# Patient Record
Sex: Male | Born: 1935
Health system: Southern US, Community
[De-identification: ages and names within clinical notes are randomized; demographics above are authoritative.]

## PROBLEM LIST (undated history)

## (undated) DIAGNOSIS — N189 Chronic kidney disease, unspecified: Secondary | ICD-10-CM

## (undated) DIAGNOSIS — Z5189 Encounter for other specified aftercare: Secondary | ICD-10-CM

## (undated) DIAGNOSIS — C801 Malignant (primary) neoplasm, unspecified: Secondary | ICD-10-CM

## (undated) DIAGNOSIS — C61 Malignant neoplasm of prostate: Secondary | ICD-10-CM

## (undated) DIAGNOSIS — H269 Unspecified cataract: Secondary | ICD-10-CM

## (undated) DIAGNOSIS — F419 Anxiety disorder, unspecified: Secondary | ICD-10-CM

## (undated) DIAGNOSIS — R011 Cardiac murmur, unspecified: Secondary | ICD-10-CM

## (undated) DIAGNOSIS — I10 Essential (primary) hypertension: Secondary | ICD-10-CM

## (undated) DIAGNOSIS — G4730 Sleep apnea, unspecified: Secondary | ICD-10-CM

## (undated) DIAGNOSIS — IMO0001 Reserved for inherently not codable concepts without codable children: Secondary | ICD-10-CM

## (undated) DIAGNOSIS — D649 Anemia, unspecified: Secondary | ICD-10-CM

## (undated) DIAGNOSIS — J189 Pneumonia, unspecified organism: Secondary | ICD-10-CM

## (undated) DIAGNOSIS — M1990 Unspecified osteoarthritis, unspecified site: Secondary | ICD-10-CM

## (undated) DIAGNOSIS — F329 Major depressive disorder, single episode, unspecified: Secondary | ICD-10-CM

## (undated) DIAGNOSIS — I00 Rheumatic fever without heart involvement: Secondary | ICD-10-CM

## (undated) DIAGNOSIS — G709 Myoneural disorder, unspecified: Secondary | ICD-10-CM

## (undated) DIAGNOSIS — I209 Angina pectoris, unspecified: Secondary | ICD-10-CM

## (undated) HISTORY — PX: JOINT REPLACEMENT: SHX530

## (undated) HISTORY — PX: CARDIAC CATHETERIZATION: SHX172

## (undated) HISTORY — PX: TONSILLECTOMY: SUR1361

## (undated) HISTORY — PX: PROSTATE CRYOABLATION: SUR358

---

## 1998-10-03 ENCOUNTER — Other Ambulatory Visit: Admission: RE | Admit: 1998-10-03 | Discharge: 1998-10-03 | Payer: Self-pay | Admitting: Urology

## 1998-10-24 ENCOUNTER — Encounter: Admission: RE | Admit: 1998-10-24 | Discharge: 1999-01-22 | Payer: Self-pay | Admitting: Radiation Oncology

## 1999-01-22 ENCOUNTER — Encounter: Admission: RE | Admit: 1999-01-22 | Discharge: 1999-04-22 | Payer: Self-pay | Admitting: Radiation Oncology

## 2000-07-29 ENCOUNTER — Encounter: Payer: Self-pay | Admitting: Family Medicine

## 2000-07-29 ENCOUNTER — Encounter: Admission: RE | Admit: 2000-07-29 | Discharge: 2000-07-29 | Payer: Self-pay | Admitting: Family Medicine

## 2001-10-06 ENCOUNTER — Encounter: Admission: RE | Admit: 2001-10-06 | Discharge: 2001-10-06 | Payer: Self-pay

## 2001-10-06 ENCOUNTER — Encounter: Payer: Self-pay | Admitting: Family Medicine

## 2001-12-29 ENCOUNTER — Ambulatory Visit (HOSPITAL_COMMUNITY): Admission: RE | Admit: 2001-12-29 | Discharge: 2001-12-29 | Payer: Self-pay | Admitting: Gastroenterology

## 2002-03-22 ENCOUNTER — Encounter: Payer: Self-pay | Admitting: Family Medicine

## 2002-03-22 ENCOUNTER — Encounter: Admission: RE | Admit: 2002-03-22 | Discharge: 2002-03-22 | Payer: Self-pay | Admitting: Family Medicine

## 2003-05-03 ENCOUNTER — Encounter: Payer: Self-pay | Admitting: Emergency Medicine

## 2003-05-03 ENCOUNTER — Inpatient Hospital Stay (HOSPITAL_COMMUNITY): Admission: EM | Admit: 2003-05-03 | Discharge: 2003-05-08 | Payer: Self-pay | Admitting: Emergency Medicine

## 2003-05-04 ENCOUNTER — Encounter (INDEPENDENT_AMBULATORY_CARE_PROVIDER_SITE_OTHER): Payer: Self-pay | Admitting: *Deleted

## 2003-11-13 ENCOUNTER — Inpatient Hospital Stay (HOSPITAL_COMMUNITY): Admission: RE | Admit: 2003-11-13 | Discharge: 2003-11-18 | Payer: Self-pay | Admitting: Orthopaedic Surgery

## 2004-09-02 ENCOUNTER — Encounter: Admission: RE | Admit: 2004-09-02 | Discharge: 2004-09-02 | Payer: Self-pay | Admitting: Cardiovascular Disease

## 2004-09-04 ENCOUNTER — Encounter: Admission: RE | Admit: 2004-09-04 | Discharge: 2004-09-04 | Payer: Self-pay | Admitting: Family Medicine

## 2004-09-05 ENCOUNTER — Ambulatory Visit (HOSPITAL_COMMUNITY): Admission: RE | Admit: 2004-09-05 | Discharge: 2004-09-05 | Payer: Self-pay | Admitting: Cardiovascular Disease

## 2006-01-15 ENCOUNTER — Ambulatory Visit (HOSPITAL_COMMUNITY): Admission: RE | Admit: 2006-01-15 | Discharge: 2006-01-15 | Payer: Self-pay | Admitting: Urology

## 2006-05-27 ENCOUNTER — Emergency Department (HOSPITAL_COMMUNITY): Admission: EM | Admit: 2006-05-27 | Discharge: 2006-05-27 | Payer: Self-pay | Admitting: Emergency Medicine

## 2006-06-01 ENCOUNTER — Encounter: Admission: RE | Admit: 2006-06-01 | Discharge: 2006-06-01 | Payer: Self-pay | Admitting: Family Medicine

## 2006-12-01 ENCOUNTER — Ambulatory Visit (HOSPITAL_COMMUNITY): Admission: RE | Admit: 2006-12-01 | Discharge: 2006-12-01 | Payer: Self-pay | Admitting: Nephrology

## 2007-05-13 ENCOUNTER — Ambulatory Visit (HOSPITAL_COMMUNITY): Admission: RE | Admit: 2007-05-13 | Discharge: 2007-05-13 | Payer: Self-pay | Admitting: Gastroenterology

## 2009-11-24 ENCOUNTER — Ambulatory Visit: Payer: Self-pay | Admitting: Cardiology

## 2009-11-24 ENCOUNTER — Observation Stay (HOSPITAL_COMMUNITY): Admission: EM | Admit: 2009-11-24 | Discharge: 2009-11-26 | Payer: Self-pay | Admitting: Emergency Medicine

## 2010-02-20 ENCOUNTER — Ambulatory Visit (HOSPITAL_BASED_OUTPATIENT_CLINIC_OR_DEPARTMENT_OTHER): Admission: RE | Admit: 2010-02-20 | Discharge: 2010-02-20 | Payer: Self-pay | Admitting: Urology

## 2010-03-10 ENCOUNTER — Encounter: Admission: RE | Admit: 2010-03-10 | Discharge: 2010-03-10 | Payer: Self-pay | Admitting: Geriatric Medicine

## 2010-05-23 ENCOUNTER — Encounter: Admission: RE | Admit: 2010-05-23 | Discharge: 2010-05-23 | Payer: Self-pay | Admitting: Internal Medicine

## 2010-09-24 ENCOUNTER — Other Ambulatory Visit: Admission: RE | Admit: 2010-09-24 | Discharge: 2010-09-24 | Payer: Self-pay | Admitting: Interventional Radiology

## 2010-09-24 ENCOUNTER — Encounter: Admission: RE | Admit: 2010-09-24 | Discharge: 2010-09-24 | Payer: Self-pay | Admitting: Geriatric Medicine

## 2011-03-08 LAB — POCT I-STAT 4, (NA,K, GLUC, HGB,HCT)
Glucose, Bld: 111 mg/dL — ABNORMAL HIGH (ref 70–99)
HCT: 38 % — ABNORMAL LOW (ref 39.0–52.0)
Hemoglobin: 12.9 g/dL — ABNORMAL LOW (ref 13.0–17.0)
Potassium: 4 mEq/L (ref 3.5–5.1)
Sodium: 144 mEq/L (ref 135–145)

## 2011-03-08 LAB — GLUCOSE, CAPILLARY: Glucose-Capillary: 99 mg/dL (ref 70–99)

## 2011-03-17 LAB — PROTIME-INR
INR: 1.06 (ref 0.00–1.49)
Prothrombin Time: 13.7 seconds (ref 11.6–15.2)

## 2011-03-17 LAB — CBC
HCT: 35.8 % — ABNORMAL LOW (ref 39.0–52.0)
HCT: 36.5 % — ABNORMAL LOW (ref 39.0–52.0)
HCT: 39.7 % (ref 39.0–52.0)
Hemoglobin: 13.8 g/dL (ref 13.0–17.0)
MCHC: 34.5 g/dL (ref 30.0–36.0)
MCHC: 34.7 g/dL (ref 30.0–36.0)
MCV: 98.1 fL (ref 78.0–100.0)
MCV: 98.7 fL (ref 78.0–100.0)
MCV: 98.9 fL (ref 78.0–100.0)
Platelets: 134 10*3/uL — ABNORMAL LOW (ref 150–400)
Platelets: 150 10*3/uL (ref 150–400)
RBC: 3.62 MIL/uL — ABNORMAL LOW (ref 4.22–5.81)
RBC: 3.7 MIL/uL — ABNORMAL LOW (ref 4.22–5.81)
RBC: 4.05 MIL/uL — ABNORMAL LOW (ref 4.22–5.81)
RDW: 13.8 % (ref 11.5–15.5)
WBC: 6.7 10*3/uL (ref 4.0–10.5)
WBC: 6.7 10*3/uL (ref 4.0–10.5)

## 2011-03-17 LAB — DIFFERENTIAL
Basophils Absolute: 0 10*3/uL (ref 0.0–0.1)
Basophils Relative: 0 % (ref 0–1)
Eosinophils Absolute: 0.1 10*3/uL (ref 0.0–0.7)
Eosinophils Relative: 2 % (ref 0–5)
Lymphocytes Relative: 20 % (ref 12–46)
Lymphs Abs: 1.4 10*3/uL (ref 0.7–4.0)
Monocytes Absolute: 0.7 10*3/uL (ref 0.1–1.0)
Monocytes Relative: 11 % (ref 3–12)
Neutro Abs: 4.4 10*3/uL (ref 1.7–7.7)
Neutrophils Relative %: 67 % (ref 43–77)

## 2011-03-17 LAB — BASIC METABOLIC PANEL
BUN: 30 mg/dL — ABNORMAL HIGH (ref 6–23)
CO2: 29 mEq/L (ref 19–32)
Calcium: 8.9 mg/dL (ref 8.4–10.5)
Chloride: 102 mEq/L (ref 96–112)
Chloride: 105 mEq/L (ref 96–112)
Creatinine, Ser: 1.76 mg/dL — ABNORMAL HIGH (ref 0.4–1.5)
GFR calc Af Amer: 42 mL/min — ABNORMAL LOW (ref >60)
GFR calc Af Amer: 46 mL/min — ABNORMAL LOW (ref >60)
GFR calc non Af Amer: 35 mL/min — ABNORMAL LOW (ref >60)
GFR calc non Af Amer: 38 mL/min — ABNORMAL LOW (ref >60)
Glucose, Bld: 101 mg/dL — ABNORMAL HIGH (ref 70–99)
Potassium: 3.9 mEq/L (ref 3.5–5.1)
Potassium: 3.9 mEq/L (ref 3.5–5.1)
Sodium: 139 mEq/L (ref 135–145)
Sodium: 143 mEq/L (ref 135–145)

## 2011-03-17 LAB — URINALYSIS, ROUTINE W REFLEX MICROSCOPIC
Bilirubin Urine: NEGATIVE
Glucose, UA: NEGATIVE mg/dL
Hgb urine dipstick: NEGATIVE
Ketones, ur: NEGATIVE mg/dL
Nitrite: NEGATIVE
Protein, ur: NEGATIVE mg/dL
Specific Gravity, Urine: 1.011 (ref 1.005–1.030)
Urobilinogen, UA: 0.2 mg/dL (ref 0.0–1.0)
pH: 7 (ref 5.0–8.0)

## 2011-03-17 LAB — CARDIAC PANEL(CRET KIN+CKTOT+MB+TROPI)
CK, MB: 1 ng/mL (ref 0.3–4.0)
Relative Index: INVALID (ref 0.0–2.5)
Relative Index: INVALID (ref 0.0–2.5)
Total CK: 60 U/L (ref 7–232)
Troponin I: 0.01 ng/mL (ref 0.00–0.06)
Troponin I: 0.02 ng/mL (ref 0.00–0.06)

## 2011-03-17 LAB — COMPREHENSIVE METABOLIC PANEL
AST: 20 U/L (ref 0–37)
BUN: 28 mg/dL — ABNORMAL HIGH (ref 6–23)
CO2: 27 mEq/L (ref 19–32)
Calcium: 8.3 mg/dL — ABNORMAL LOW (ref 8.4–10.5)
Creatinine, Ser: 1.74 mg/dL — ABNORMAL HIGH (ref 0.4–1.5)
GFR calc Af Amer: 47 mL/min — ABNORMAL LOW (ref >60)
GFR calc non Af Amer: 38 mL/min — ABNORMAL LOW (ref >60)

## 2011-03-17 LAB — POCT CARDIAC MARKERS
CKMB, poc: 1.1 ng/mL (ref 1.0–8.0)
CKMB, poc: 1.7 ng/mL (ref 1.0–8.0)
Myoglobin, poc: 128 ng/mL (ref 12–200)
Myoglobin, poc: 207 ng/mL (ref 12–200)
Troponin i, poc: <0.05 ng/mL (ref 0.00–0.09)
Troponin i, poc: <0.05 ng/mL (ref 0.00–0.09)

## 2011-03-17 LAB — LIPID PANEL
Cholesterol: 147 mg/dL (ref 0–200)
LDL Cholesterol: 83 mg/dL (ref 0–99)
Total CHOL/HDL Ratio: 5.1 RATIO

## 2011-03-17 LAB — GLUCOSE, CAPILLARY
Glucose-Capillary: 105 mg/dL — ABNORMAL HIGH (ref 70–99)
Glucose-Capillary: 142 mg/dL — ABNORMAL HIGH (ref 70–99)
Glucose-Capillary: 152 mg/dL — ABNORMAL HIGH (ref 70–99)
Glucose-Capillary: 72 mg/dL (ref 70–99)
Glucose-Capillary: 96 mg/dL (ref 70–99)

## 2011-03-17 LAB — HEMOGLOBIN A1C
Hgb A1c MFr Bld: 5.8 % (ref 4.6–6.1)
Mean Plasma Glucose: 120 mg/dL

## 2011-04-28 NOTE — Op Note (Signed)
NAME:  Christopher Hensley, Christopher Hensley              ACCOUNT NO.:  [id number]   MEDICAL RECORD NO.:  [id number]          PATIENT TYPE:  AMB   LOCATION:  ENDO                         FACILITY:  [facility]   PHYSICIAN:  Anselmo Rod, M.D.  DATE OF BIRTH:  Sep 06, 1932   DATE OF PROCEDURE:  DATE OF DISCHARGE:                               OPERATIVE REPORT   PROCEDURE PERFORMED:  Screening colonoscopy.   SURGEON:  Engineer, building services.   INSTRUMENT USED:  Pentax video colonoscope.   INDICATIONS FOR PROCEDURE:  A 77 year old white male with a personal  history of prostate cancer and occasional rectal bleeding undergoing  screening colonoscopy to rule out colonic polyps, masses, etc.   PREPROCEDURE PREPARATION:  Informed consent was procured from the  patient.  The patient fasted for 8 hours prior to the procedure and  prepped with Dulcolax pills and a gallon of NuLYTELY the night prior to  the procedure.  Risks and benefits of the procedure including a 10% miss  rate of cancer and polyp were discussed with the patient as well.   PREPROCEDURE PHYSICAL:  The patient had stable vital signs.  Neck was  supple.  Chest clear to auscultation.  S1 and S2 regular.  Abdomen soft  with normal bowel sounds.   DESCRIPTION OF PROCEDURE:  The patient was placed in the left lateral  decubitus position and sedated with 50 mcg of Fentanyl and 5 mg of  Versed given intravenously in slow incremental doses. Once the patient  was adequately sedated and maintained on low-flow oxygen and continuous  cardiac monitoring the Pentax video colonoscope was advanced from the  rectum to cecum.  The appendiceal orifice and cecal valve were clearly  visualized and photographed. The terminal ileum appeared healthy and  without lesions.  No masses or polyps were seen.  There was scattered  diverticula throughout the colon with more prominent changes in the left  colon.  Small internal hemorrhoids were seen on retroflexion.  The rest  of the  exam was unremarkable.  The patient tolerated the procedure well  without complications.   IMPRESSION:  1. Small internal hemorrhoids seen on retroflexion.  2. Scattered diverticulosis with more prominent changes in the left      colon.  3. Normal terminal ileum.   RECOMMENDATIONS:  1. Continue high fiber diet with liberal fluid intake.  Brochures on      diverticulosis have been given to the patient for his education.  2. Repeat colonoscopy in the next 10 years.  If the patient has any      abnormal symptoms in the interim he should contact the office      immediately for further recommendations.  3. Outpatient follow-up as need arises in the future.      Anselmo Rod, M.D.  Electronically Signed     JNM/MEDQ  D:  05/13/2007  T:  05/13/2007  Job:  [id number]   cc:   Vonzell Schlatter. Patsi Sears, M.D.

## 2011-05-01 NOTE — H&P (Signed)
NAME:  Christopher Hensley, Christopher Hensley                        ACCOUNT NO.:  [id number]   MEDICAL RECORD NO.:  [id number]                   PATIENT TYPE:  INP   LOCATION:  1824                                 FACILITY:  [facility]   PHYSICIAN:  Ollen Gross. Vernell Morgans, M.D.              DATE OF BIRTH:  1932/08/31   DATE OF ADMISSION:  05/03/2003  DATE OF DISCHARGE:                                HISTORY & PHYSICAL   HISTORY OF PRESENT ILLNESS:  Christopher Hensley is a 77 year old white male who  has a chief complaint of abdominal pain.  Christopher Hensley states that this  abdominal pain  has been occurring intermittently for the last three weeks  or so.  The pain seems to come and go, but over the last week or so has been  more severe and more frequent.  He has not had any nausea or vomiting.  His  bowels have been a little bit loose, but they have been that way since  receiving radiation therapy for prostate cancer, but his bowels have not  changed over the recent weeks.  The pain is mostly in the left mid abdomen,  but he complains of some diffuse tenderness.  He has run low-grade fevers at  home around 99 degrees, but no significant fever.   REVIEW OF SYSTEMS:  He denies any nausea, vomiting, fever, chills, chest  pain, shortness of breath, dysuria, or constipation.  The rest of his review  of systems is unremarkable.   PAST MEDICAL HISTORY:  Significant for:  1. Hypertension.  2. Prostate cancer.  3. Gout.   PAST SURGICAL HISTORY:  Significant for tonsillectomy.   MEDICATIONS:  1. Zestoretic 20/25 mg one p.o. daily.  2. Lasix 40 mg p.o. daily.  3. Celebrex 200 mg p.o. daily.   ALLERGIES:  No known drug allergies.   SOCIAL HISTORY:  The patient drinks alcohol.  Denies any tobacco use.   FAMILY HISTORY:  Significant for unknown cancers.   PHYSICAL EXAMINATION:  VITAL SIGNS:  His temperature is 97.3 degrees, blood  pressure 174/78, and pulse 61.  GENERAL APPEARANCE:  He is a well-developed, well-nourished,  white male,  moderately obese and in no acute distress.  SKIN:  Warm and dry with no jaundice.  HEENT:  His extraocular movements are intact.  Pupils equal, round, and  reactive to light.  The sclerae are nonicteric.  NECK:  No bruits.  I cannot palpate any thyroid mass.  His trachea is  midline.  LUNGS:  Clear to auscultation bilaterally with no use of accessory  respiratory muscles.  HEART:  Regular rate and rhythm with an impulse in the left chest.  ABDOMEN:  Obese, but soft with mild tenderness in the left mid abdomen.  No  guarding or peritoneal signs.  I cannot palpate mass or hepatosplenomegaly.  EXTREMITIES:  No cyanosis, clubbing, or edema.  PSYCHOLOGIC:  He is alert and oriented x 3  with evidence of depression or  anxiety.   LABORATORY DATA:  On review of his laboratory work, his UA is normal.  His  liver functions are normal.  His white count is 16,100 with a hemoglobin of  12.8, a hematocrit of 37.4, and a platelet count of 262,000.  He has a  normal amylase and lipase.  On review of his CT scan, he has what appears to  be a small diverticulum off his lower small bowel that fills with contrast  with some inflammatory changes around it.  No evidence of perforation or  abscess.   ASSESSMENT AND PLAN:  This is a 77 year old white male with what appears to  be a possible Meckel's diverticulitis.  We will plan to admit him for pain  control and start him on antibiotics and broad spectrum antibiotic coverage  and acid blocker agents.  We will follow him clinically and see how he does.  If he improves, then we will talk to him and his family about undergoing  elective procedure to resect this diverticulum.  If he worsens, he may  require more a urgent type of surgery.  I have discussed this plan with him  and his family and they are agreeable and understand.  All of their  questions were answered.  We will plan to admit him today and follow him.                                                Ollen Gross. Vernell Morgans, M.D.    PST/MEDQ  D:  05/03/2003  T:  05/03/2003  Job:  [id number]

## 2011-05-01 NOTE — Consult Note (Signed)
NAME:  Christopher Hensley, Christopher Hensley                        ACCOUNT NO.:  [id number]   MEDICAL RECORD NO.:  [id number]                   PATIENT TYPE:  INP   LOCATION:  5037                                 FACILITY:  [facility]   PHYSICIAN:  Norwood Levo, MD               DATE OF BIRTH:  December 26, 1931   DATE OF CONSULTATION:  DATE OF DISCHARGE:                                   CONSULTATION   PRIMARY CARE PHYSICIAN:  Talmadge Coventry, M.D.   CHIEF COMPLAINT:  Bilateral knee pain.   HISTORY OF PRESENT ILLNESS:  A 77 year old white male with bilateral knee  pain, left greater than right. Undergone evaluation by Dr. Cleophas Dunker with  diagnosis of end-stage osteoarthritic left knee status post left total knee  replacement. At present I am called to evaluate persistent fever over the  last 48-72 hours. The patient describes preoperative respiratory infection.  Chest x-ray done preoperatively shows no infiltrates at that time. This was  on the 24th of November and the patient however was given empiric  amoxicillin, which he had taken seven days worth prior to this operative  intervention. The patient describes a vague symptomatology with no SOB, no  fevers, chills, nausea, vomiting, or diarrhea at that time and could not  describe whether he has had any purulent expectorate prior to being  hospitalized for his left knee surgery. At this time he is a poor historian,  vague. Describes only intermittent fever spikes. Denies any cough, any nasal  drainage, any purulent expectorate, any shortness of breath, any blood  pressure. Denies any dysuria, polyuria, and has had only one bowel movement  since admission.   PAST MEDICAL HISTORY:  Significant for bilateral osteoarthritic knees.  History of prostate cancer status post RTX, hypertension, history of gout,  history of anemia, back pain, and recent history of Meckel's diverticulum in  May 2004.   PAST SURGICAL HISTORY:  1. Tonsillectomy.  2. Status post  Meckel's diverticular resection in May without complications.  3. Recent left total knee replacement by Dr. Cleophas Dunker.   ADMISSION MEDICATIONS:  1. Lasix 40 p.o. daily.  2. Lisinopril/HCTZ 20/25 1 tab p.o. daily.  3. Advil p.r.n.  4. MDI p.r.n.  5. Vitamin C 1000 p.o. daily.  6. Amoxicillin started November 06, 2003, incomplete regimen.  7. Promethazine started on November 06, 2003.   ALLERGIES:  No known drug allergies.   PRESENT MEDICATIONS:  1. Colace 100 mg p.o. b.i.d.  2. Trinsicon 1 tab p.o. t.i.d.  3. Warfarin per pharmacy protocol.  4. Lasix 40 p.o. q.i.d.  5. Heparin subcu q.12h. 5000 units.  6. Lisinopril 20 mg p.o. daily.  7. HCTZ 25 mg p.o. daily.  8. MDI 1 tab p.o. daily.  9. Dulcolax suppository 10 mg p.r.n.  10.      Amoxicillin 500 mg p.o. t.i.d.  11.      Senokot 1 tab p.o. daily.  12.  Percocet 1-2 tabs 5/325 p.o. q.4h. p.r.n.  13.      Phenergan 12.5 IV q.6h. p.r.n. as well as 24 mg suppositories q.6h.     p.r.n.  14.      Tylenol 650 p.o. q.4h. p.r.n.  15.      Robaxin 500 mg p.o. q.6h. p.r.n.  16.      Zestril 50 mg p.o. q.h.s. p.r.n.   LABORATORY DATA:  PT/INR 1.3, WBC 2.7, hemoglobin 11.5, PLT 189, Sodium 134,  K 3.8, chloride 97, CO2 29, glucose 137, BUN 18, creatinine 1.5, calcium  7.9. Admitting urinalysis was negative.   Review of systems, social history, and family history as per HPI on  admission.   PHYSICAL EXAMINATION:  VITAL SIGNS:  T-max 102.1, pulse 94, respirations 20,  blood pressure 139/54.  HEENT:  NAD, no SOB, no chest pain. Nonicteric sclerae. PERRL, EOMI.  HEART:  Regular rate and rhythm. No S1, S2. No murmur, rub or gallop.  CHEST:  Low grade wheezing at the bases. Otherwise CTAB.  ABDOMEN:  Obese, positive bowel sounds. Soft. Nondistended, nontender. No  hepatosplenomegaly.  EXTREMITIES:  Left knee incision drainage intact. Swollen and warm, but no  more than the right side. Negative Homan's. Right extremity:  The right   hallux no erythema, slight tenderness and pain on motion.   ASSESSMENT AND PLAN:  1. Fever.  Recommendations:  Agree with UA, urine culture, blood culture,     chest portable to rule out upper respiratory and urinary infections.     Should consider the possibility of TKR septic joint. Would at this time     discontinue amoxicillin and outpatient for Unasyn intravenously until     blood cultures return. Would add ESR, though this would be expected to be     elevated.  2. Ortho:  Status post left BKR.  Less likely, but must be wary of     postoperative perioperative joint infection.  3. Right hallux gout. Agree with colchicine. No effusion that can be tapped     for crystal analysis at this time. I would add indomethacin SR 75 mg p.o.     daily x3 days for anti-inflammatory and pain relief. Would discontinue     afterwards because of potential renal side effects.  4. Other issues as per previous protocol.   RECOMMENDATIONS:  1. Urinalysis, urine culture, blood cultures x2. Chest x-ray rule out     infiltrate. Unasyn intravenously, discontinue amoxicillin.  2. Colchicine, add indomethacin SR.  3. Continue with medications as previously for all other medical     comorbidities.   Thank you for the consultation, will follow with you.                                               Norwood Levo, MD    APM/MEDQ  D:  11/16/2003  T:  11/16/2003  Job:  [id number]   cc:   Talmadge Coventry, M.D.  [address]  [place]  [place] [address]  Fax: [phone]   Claude Manges. Cleophas Dunker, M.D.  [address] [place]  [place] [address]  Fax: [phone]

## 2011-05-01 NOTE — H&P (Signed)
NAME:  Christopher Hensley, Christopher Hensley                        ACCOUNT NO.:  [id number]   MEDICAL RECORD NO.:  [id number]                   PATIENT TYPE:  INP   LOCATION:  NA                                   FACILITY:  [facility]   PHYSICIAN:  Claude Manges. Cleophas Dunker, M.D.            DATE OF BIRTH:  1932/07/03   DATE OF ADMISSION:  11/13/2003  DATE OF DISCHARGE:                                HISTORY & PHYSICAL   CHIEF COMPLAINT:  Bilateral knee pain.   HISTORY OF PRESENT ILLNESS:  Christopher Hensley is a 77 year old white male with  bilateral knee pain.  Left knee pain is greater than right knee pain.  The  left knee pain has been ongoing for at least five years.  Right knee pain  has been an issue for approximately one year.  When asked to describe his  knee pain he says just hurts.  He denies any radiation of the pain.  The  left knee hurts mostly at the inferior pole of the patella.  Right knee pain  is in the medial compartment.  Most of the pain occurs with ambulation.  Pain does not awaken him.  He denies any mechanical symptoms.  He has tried  Celebrex for the pain with very little relief.  He gets better relief with  Advil which he is currently taking.  Has had one cortisone injection into  the right knee.  This gave him good relief.  He uses no assistive devices to  ambulate.  Therefore, since the patient has failed conservative treatment,  he will undergo a left total knee arthroplasty on November 13, 2003 at [facility].   ALLERGIES:  No known drug allergies.   MEDICATIONS:  1. Furosemide 40 mg daily.  2. Lisinopril/HCTZ 20/25 mg daily.  3. Advil daily.  4. Multivitamin daily.  5. Vitamin C 1000 mg daily.  6. Amoxicillin started November 06, 2003.  7. Promethazine started November 06, 2003.   PAST MEDICAL HISTORY:  1. Bilateral osteoarthritis of the knees.  2. History of prostate cancer (underwent radiation treatment).  3. Hypertension.  4. History of gout.  5. History of anemia.  6. History of back pain.  7. History of Meckel's diverticulum.   PAST SURGICAL HISTORY:  1. Tonsillectomy.  2. Removal of portion of his small intestine due to Meckel's diverticulum.     The patient states he had no complications with any of the above     procedures or the anesthesia and he required no blood products.   SOCIAL HISTORY:  The patient denies any tobacco or alcohol use.  He is  married and has two grown children.  Family physician is Christopher Hensley,  M.D. [phone]).  The patient had a history and physical with Christopher Hensley, M.D. approximately one week ago and was cleared for surgery.  He  was given some antibiotics due to a cold and he was told to take  this if his  symptoms did not clear.  The patient lives in a one-story home with two  steps at the usual entrance.  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   FAMILY HISTORY:  Mother deceased at age 63 due to gynecologic cancer.  Otherwise, no known health problems.  Father deceased at age 14 with a  questionable heart attack, otherwise, healthy.  Remainder of family history  reveals a family history of hypertension.   REVIEW OF SYSTEMS:  The patient has had a recent cold for approximately one  week.  Began taking antibiotics for this on November 06, 2003.  He does  state he has some chest pain at times; however, has had a yearly EKG and a  remote cardiac stress test which he states all were negative.  Had some  orthopnea, but denies PND.  Dyspnea with exertion, especially going up  stairs.  The patient is very vague about his shortness of breath and tends  to even deny.  However, his wife who is present states that he does get  short of breath and she believes he has sleep apnea.  The patient has not  received any type of work-up for sleep apnea.  The patient has monthly  episodes of diarrhea.  Otherwise, review of systems is negative.  The  patient wears top and lower  dentures and glasses.   PHYSICAL EXAMINATION:  GENERAL:  The patient is a well-developed, well-  nourished, obese male who walks with an antalgic gait and limp, especially  favoring the left side.  He has obvious bilateral varus deformities of the  knees.  The patient's mood and affect are appropriate.  He talks easily with  examiner.  VITAL SIGNS:  Height 6 feet, weight 260 pounds.  Temperature 97.4, pulse 66,  blood pressure 138/70, respiratory rate 18.  CARDIAC:  Regular rate and rhythm with no murmurs, rubs, or gallops noted.  LUNGS:  Clear to auscultation bilaterally.  ABDOMEN:  Obese, nontender.  Positive bowel sounds throughout.  In the left  upper quadrant there is a small ventral hernia.  Also noted is a  longitudinal surgical incision through the umbilicus that is well healed.  BACK:  Nontender to palpation over the thoracic and lumbar spine.  NECK:  Trachea midline.  No lymphadenopathy noted.  Carotids are 2+ without  bruits.  Palpation of the cervical spine reveals no tenderness.  He has full  range of motion of the cervical spine.  HEENT:  Normocephalic and atraumatic without frontal or maxillary sinus  tenderness.  Conjunctivae are pink.  Sclerae nonicteric.  PERRLA.  EOMs  intact.  No visible external ear deformities.  TMs are pearly and grey  bilaterally.  Nose:  Nasal septum is midline.  Nasal mucosa is pink and  moist without polyps.  Both nares are patent; however, patient tends to  breathe through his mouth instead of his nares.  Buccal mucosa is pink and  moist.  Pharynx without erythema or exudate.  Tongue and uvula are midline.  BREAST:  Deferred at this time.  GENITOURINARY:  Deferred at this time.  RECTAL:  Deferred at this time.  NEUROLOGIC:  Alert and oriented x3.  Cranial nerves II-XII are grossly  intact.  Upper and lower extremity strength testing against resistance reveals 5/5 throughout.  Deep tendon reflexes in the upper and lower  extremities are 2+  bilaterally.  MUSCULOSKELETAL:  Full range of motion with both  hips.  Right knee is 0-110-  115 degrees of flexion.  He has medial compartment tenderness with  palpation.  McMurray's is negative.  Slight varus deformity is noted.  Left  knee negative 5 degrees extension, 110 degrees flexion.  Palpation of the  medial inferior pole of the patella reveals tenderness.  Has a valgus  deformity the left being greater than right.  Valgus/varus stressing reveals  no laxity.  1+ edematous changes in both extremities with some early brawny  changes.  Dorsal pedal pulse is 1+ bilaterally.  EHL and FHL are intact.  Upper extremity examination reveals full range of motion of the upper  extremities.  Otherwise, the upper extremities are motor/vascularly intact  with 2+ radial pulses equal and symmetric.   LABORATORIES:  X-rays of the left knee show bone on bone contact with 5-6  degrees of varus.  Right knee has minimal joint space remaining.   IMPRESSION:  1. Bilateral end-stage osteoarthritis both knees, left greater than right.  2. Hypertension.  3. History of gout.  4. History of anemia.  5. History of back pain.  6. History of Meckel's diverticulum.  7. History of prostate cancer (underwent radiation treatment).   PLAN:  The patient is to be admitted to [facility] on November 13, 2003 and will undergo a total knee replacement on the left.      Richardean Canal, P.A.                       Claude Manges. Cleophas Dunker, M.D.    GC/MEDQ  D:  11/07/2003  T:  11/07/2003  Job:  [id number]

## 2011-05-01 NOTE — Cardiovascular Report (Signed)
Christopher Hensley, Christopher Hensley              ACCOUNT NO.:  [id number]   MEDICAL RECORD NO.:  [id number]          PATIENT TYPE:  OIB   LOCATION:  2899                         FACILITY:  [facility]   PHYSICIAN:  Richard A. Alanda Amass, M.D.DATE OF BIRTH:  1932/08/31   DATE OF PROCEDURE:  09/05/2004  DATE OF DISCHARGE:  09/05/2004                              CARDIAC CATHETERIZATION   PROCEDURE:  Retrograde central aortic catheterization, selective coronary  angiography via Judkins technique, LV angiogram RAO, LAO projection,  abdominal angiogram midstream PA projection.   DESCRIPTION OF PROCEDURE:  The patient was brought to the second floor CP  lab in postabsorptive state after 5 mg of Valium p.o. premedication.  He was  given 2 mg of Versed for sedation in the laboratory. He was hydrated  preoperatively.  CBC and diff, coags, BNP were normal with glucose of 94,  BUN and creatinine 20/1.3, normal LFTs.  Outpatient glucose was 105 and  hemoglobin A1c was slightly elevated at 6.3.  Triglycerides 250, LDL 83,  cholesterol 162.  Informed consent was obtained to proceed with  catheterization preoperatively.  The right groin was prepped, draped in the  usual manner.  1% Xylocaine was used for local anesthesia.  The CRFA was  entered with single anterior puncture using 18 thin-wall needle and a 6  French short Daig side arm sheath was inserted without difficulty.  Catheterization was done with 6 French 4 cm taper preformed Cordis coronary  and pigtail catheters using Omnipaque dye throughout the procedure.  LV  angiogram was done in the RAO and LAO projection at 25 mL, 14 mL per second;  20 mL, 12 mL per second.  Pullback pressure of the CA showed no gradient  across the aortic valve.  Abdominal angiogram was done in the midstream PA  projection above the level of the renal arteries at 25 mL, 20 mL per second.  Catheter was removed, side arm sheath was flushed.  The patient was brought  to the holding area  for sheath removal and pressure hemostasis in stable  condition.  He tolerated the procedure well.   PRESSURES:  1.  LV:  146/0; LVEDP 16-18 mmHg.  2.  CA:  146/82 mmHg.  3.  There is no gradient across the aortic valve on catheter pullback.   Fluoroscopy did not show any intracoronary, intracardiac or valvular  calcification.   The main left coronary is short and normal.   The left anterior descending was a large vessel that coursed to the apex of  the heart and was widely patent and smooth throughout its course.  It gave  off a large DX1 that bifurcated and was normal and a moderate size DX2 from  the proximal third that bifurcated and was normal.  This was followed by  large septal perforator branch and multiple septal perforators beyond this  which were normal.   The circumflex artery was nondominant with two small proximal marginal  branches following by a PAVG branch that bifurcated and gave off a normal  left atrium and then a large OM-3 that bifurcated and was normal.   The right  coronary was a very large dominant vessel.  There is some very  mild, less than 20%, smooth narrowing of the RCA at the proximal bend not  visualized in the LAO projection.  The remainder of the vessel was widely  patent and smooth with very large PDA and large bifurcating PLA.  The RV  branches from the mid RCA were normal.   Abdominal angiogram demonstrated a normal celiac and SMA axis, patent IMA.  The renal arteries were single and normal bilaterally and the common and  external iliacs were normal with patent hypogastrics bilaterally.  Vessels  were smooth with no significant atherosclerosis.  There was no aneurysm  present of the infrarenal abdominal aneurysm.   DISCUSSION:  Mr. Appling is a very pleasant 77 year old married father of  two with no grandchildren.  He is a nonsmoker.  Has severe exogenous  obesity, probable sleep apnea, hypertriglyceridemia, mild elevated  hemoglobin A1c  compatible with metabolic syndrome.  He has systemic  hypertension and arthritis with prior left total knee (November 2004).  He  has had remote negative treadmill exercise test in 1998 by Dr. Clarene Duke.  He  was referred again for evaluation of left precordial chest pain with some  radiation to the left arm.  Because of his severe obesity, we did not think  further noninvasive testing would be helpful so cardiac catheterization was  recommended and performed.  Fortunately, he has normal coronary arteries and  left ventricle.  Mild-to-moderate hypertension on medical therapy with  normal renal arteries.  He has hypertriglyceridemia, mild glucose elevation  and elevated A1c with severe obesity compatible with metabolic syndrome  which would put him at risk of development of coronary disease in the  future.  I would recommend empiric GI therapy for his chest pain at present  with medical followup.  Weight reduction exercise program.  Sleep study for  probable sleep apnea (sleeps in recliner at night).   CATHETERIZATION DIAGNOSES:  1.  Chest pain, etiology not determined.  2.  Possible gastroesophageal reflux disease.  3.  Severe exogenous obesity, medically complicated.  4.  Metabolic syndrome.  5.  Hypertriglyceridemia.  6.  Systemic hypertension.  7.  Obesity.  8.  Elevated A1c.  9.  Normal renal arteries.  10. Suspect sleep apnea.  11. Left knee replacement November 2004, Dr. Cleophas Dunker..  12. Meckel's diverticulum and small bowel resection Dr. Carolynne Edouard, May 2004.       RAW/MEDQ  D:  09/05/2004  T:  09/06/2004  Job:  [id number]   cc:   Thereasa Solo. Little, M.D.  [address]  [address]  [place]  [place] [address]  Fax: [phone]   Talmadge Coventry, M.D.  [address]  [place]  [place] [address]  Fax: [phone]

## 2011-05-01 NOTE — Discharge Summary (Signed)
   NAME:  Christopher Hensley, Christopher Hensley                        ACCOUNT NO.:  [id number]   MEDICAL RECORD NO.:  [id number]                   PATIENT TYPE:  INP   LOCATION:  5725                                 FACILITY:  [facility]   PHYSICIAN:  Ollen Gross. Vernell Morgans, M.D.              DATE OF BIRTH:  1932-06-28   DATE OF ADMISSION:  05/03/2003  DATE OF DISCHARGE:  05/08/2003                                 DISCHARGE SUMMARY   HISTORY:  Briefly, [name] is a 77 year old gentleman who was admitted  with left-sided abdominal pain. He had a CT scan that showed what appeared  to be an inflamed Metcliff's diverticulitis. He improved a little bit on  antibiotics, but continued to have pain and on May 21 was taken to the  operating room and explored. The jejunal diverticulum that was inflamed was  found. This was resected with a segmental small-bowel resection.  Postoperatively he did well and on postoperative day four he was now  tolerating a diet, ambulating, and ready for discharge home. He was  restarted on a steroid taper for gout in his foot as well as being restarted  on Zestoretic for his hypertension.   DISCHARGE ACTIVITIES:  No heavy lifting.   DIET:  As tolerated.   DISCHARGE MEDICATIONS:  He is to resume his home medications including the  steroid taper. He was taking a prescription for Vicodin for pain.   DISCHARGE FOLLOW UP:  Follow-up will be with Dr. Carolynne Edouard in two weeks.   FINAL DIAGNOSIS:  Jejunal diverticulum.                                               Ollen Gross. Vernell Morgans, M.D.    PST/MEDQ  D:  06/05/2003  T:  06/06/2003  Job:  [id number]

## 2011-05-01 NOTE — Procedures (Signed)
[place]  Patient:    Christopher Hensley, Christopher Hensley Visit Number: [id number] MRN: [id number]          Service Type: END Location: ENDO Attending Physician:  Charna Elizabeth Dictated by:   Anselmo Rod, M.D. Proc. Date: 12/29/01 Admit Date:  12/29/2001   CC:         Talmadge Coventry, M.D.  Sigmund I. Patsi Sears, M.D.   Procedure Report  DATE OF BIRTH:  March 22, 1932.  PROCEDURE:  Screening colonoscopy.  ENDOSCOPIST:  Anselmo Rod, M.D.  INSTRUMENT USED:  Olympus video colonoscope.  INDICATION FOR PROCEDURE:  A 77 year old white male with a history of prostate CA, undergoing a screening colonoscopy to rule out colonic polyps, masses, hemorrhoids, etc.  PREPROCEDURE PREPARATION:  Informed consent was procured from the patient. The patient was fasted for eight hours prior to the procedure and prepped with a bottle of magnesium citrate and a gallon of NuLytely the night prior to the procedure.  PREPROCEDURE PHYSICAL:  VITAL SIGNS:  The patient had stable vital signs.  NECK:  Supple.  CHEST:  Clear to auscultation.  S1, S2 regular.  ABDOMEN:  Soft with normal bowel sounds.  DESCRIPTION OF PROCEDURE:  The patient was placed in the left lateral decubitus position and sedated with 50 mg of Demerol and 4 mg of Versed intravenously.  Once the patient was adequately sedate and maintained on low-flow oxygen and continuous cardiac monitoring, the Olympus video colonoscope was advanced from the rectum to the cecum without difficulty.  The patient had an excellent prep.  There were mild radiation changes seen in the rectum with some telangiectasia of the blood vessels, but no masses, polyps, erosions, ulcerations, or diverticula were seen.  There was no evidence of hemorrhoids.  The procedure was complete up to the cecum.  The ileocecal valve and the appendiceal orifice were clearly visualized and photographed.  IMPRESSION:  A  healthy-appearing colon up to the cecum except for mild radiation proctitis.  RECOMMENDATIONS: 1. A high-fiber diet. 2. Repeat colorectal cancer screening in the next five to 10 years unless the    patient were to develop any abnormal symptoms in the interim. Dictated by:   Anselmo Rod, M.D. Attending Physician:  Charna Elizabeth DD:  12/29/01 TD:  12/29/01 Job: [id number]

## 2011-05-01 NOTE — Op Note (Signed)
NAME:  Christopher Hensley, Christopher Hensley              ACCOUNT NO.:  [id number]   MEDICAL RECORD NO.:  [id number]          PATIENT TYPE:  AMB   LOCATION:  DAY                          FACILITY:  [facility]   PHYSICIAN:  Sigmund I. Patsi Sears, M.D.DATE OF BIRTH:  1932-09-19   DATE OF PROCEDURE:  01/15/2006  DATE OF DISCHARGE:                                 OPERATIVE REPORT   PREOPERATIVE DIAGNOSIS:  Recurrent adenocarcinoma of the prostate.   POSTOPERATIVE DIAGNOSIS:  Recurrent adenocarcinoma of the prostate.   OPERATIONS:  Cryotherapy of recurrent prostate cancer.   SURGEON:  Sigmund I. Patsi Sears, M.D.   ANESTHESIA:  General LMA.   PREPARATION:  After appropriate preanesthesia, the patient is brought to the  operating room, placed on the operating table in dorsal supine position  where general LMA anesthesia was introduced. He was replaced in dorsal  lithotomy position where pubis, scrotum and perineum were prepped with  Betadine solution and draped in the usual fashion.   REVIEW OF HISTORY:  This 77 year old white married male, has a history of  adenocarcinoma of the prostate, Gleason 3+3, originally with an 84 mL gland.  The original PSA was 8.5 in 1999. He was treated with external beam  radiation therapy, 7100 gray in January of 2000. PSA fell to a nadir of 0.05  in 2006 but then rose to 1.36 in September 2006, indicating local  recurrence, and was still 1.25 in October 2006. He is currently for  cryotherapy of the prostate.   DESCRIPTION OF PROCEDURE:  Cystourethroscopy was accomplished, and showed a  normal-appearing bladder with an open bladder neck. After filling the  bladder with approximately 400 mL of normal saline, the patient was placed  in Trendelenburg. A surgical incision was made in the midline between the  umbilicus and the pubis, and a suprapubic catheter placed under direct  vision into the bladder. It was coiled in position and locked in position  and placed to straight  drainage.   The scrotum was then sutured to the abdominal wall, and also a towel was  placed over the scrotum, and the scrotum compressed against the abdominal  wall and the towel was clipped. In addition, a Vidrape was placed over the  scrotum to hold the scrotum out of the area of freezing.   The patient was then replaced in the supine position, and a transrectal  ultrasound was placed. Three rows of cryoneedles were then placed, on five  separate channels channel. The first row were on channel 1, and the second  row were on channel 2 and the third, fourth and fifth needles were all on  separate channels. This allowed for the best control of the ice ball.   Probes were then placed at the external sphincter, and also in Denonvilliers  fascia under ultrasonic guidance.   Freeze thaw cycle x2 was then accomplished, with care taken to avoid too  temperatures of the external sphincter and Denonvilliers fascia. Excellent  freeze thaw cycles were observed. It is noted that the patient had a  transurethral warming catheter placed at the beginning of the procedure, and  remained in  for the entire procedure, and for 20 minutes after the  procedure.   Following two separate freeze thaw cycles, the probes were removed. The  sutures were removed from the scrotum, and a perineal dressing was applied.  The patient was then awakened and taken to the recovery room in good  condition.      Sigmund I. Patsi Sears, M.D.  Electronically Signed     SIT/MEDQ  D:  01/15/2006  T:  01/15/2006  Job:  [id number]   cc:   Talmadge Coventry, M.D.  Fax: [phone]

## 2011-05-01 NOTE — Op Note (Signed)
NAME:  Christopher Hensley, Christopher Hensley                        ACCOUNT NO.:  [id number]   MEDICAL RECORD NO.:  [id number]                   PATIENT TYPE:  INP   LOCATION:  5725                                 FACILITY:  [facility]   PHYSICIAN:  Ollen Gross. Vernell Morgans, M.D.              DATE OF BIRTH:  August 21, 1932   DATE OF PROCEDURE:  05/04/2003  DATE OF DISCHARGE:                                 OPERATIVE REPORT   PREOPERATIVE DIAGNOSES:  Inflamed Meckel's diverticulum.   POSTOPERATIVE DIAGNOSES:  Inflamed jejunal diverticulum.   OPERATION PERFORMED:  Exploratory laparoscopy, subsequent open segmental  small bowel resection.   SURGEON:  Ollen Gross. Carolynne Edouard, M.D.   ANESTHESIA:  General endotracheal.   DESCRIPTION OF PROCEDURE:  After informed consent was obtained, the patient  was brought to the operating room and placed in supine position on the  operating table.  After adequate induction of general anesthesia, the  patient's abdomen was prepped with Betadine and draped in the usual sterile  manner.  Prior to draping, the patient was found to have a tick on his left  thigh.  The tick was removed and sent to pathology to test him for [facility] spotted fever. The site was marked.  Then the patient was prepped  with Betadine and draped in the usual sterile manner.  The area below the  umbilicus was infiltrated with 0.25% Marcaine and a small incision was made  with a 15 blade knife and blunt dissection was then carried out through the  subcutaneous tissue bluntly with a Kelly clamp and army-navy retractors  until the linea alba was identified.  The linea alba was incised with a 15  blade knife and each side was grasped with Kocher clamps and elevated  anteriorly.  The preperitoneal space was then probed bluntly with hemostats  until the peritoneum was opened and access was gained to the abdominal  cavity.  A 0 Vicryl pursestring suture was placed in the fascia surrounding  the opening. A Hasson cannula was  placed through the opening and anchored in  place with the previously placed Vicryl pursestring stitch.  The abdomen was  then insufflated with carbon dioxide without difficulty.  Above the  umbilicus, two sites were chosen for placement of 5 mm ports and each of  these areas was infiltrated with 0.25% Marcaine.  Small stab incisions were  made with a 15 blade knife.  5 mm ports were then placed bluntly through  these incisions into the abdominal cavity under direct vision without  difficulty.  A laparoscope was placed through the Hasson cannula and the  small bowel was examined.  In the left abdomen there was an obvious large  diverticulum off the small bowel that appeared to be coming off the  mesenteric side of the small bowel.  Approximately at the mid jejunum.  The  bowel was run proximally and distally as much as could be  done  laparoscopically and there were no other abnormalities noted.  The rest of  the abdomen looked clean laparoscopically.  The 5 mm camera was inserted  through one of the 5 mm ports and an endoscopic Tanja Port was placed through  the Hasson cannula and used to grasp the area of the bowel where the  diverticulum was.  The gas ws then allowed to escape and the lower incision  was opened superiorly to connect to the inferior 5 mm port.  This incision  was carried down through the skin and subcutaneous tissue sharply with the  Bovie electrocautery.  The linea alba was also incised with the bipolar  electrocautery until access was gained to the abdominal cavity and the rest  of the incision was opened under direct vision.  The Tanja Port was pulled up  into the wound with the area of small bowel that had the diverticulum.  The  actual diverticulum and mesentery was large enough that the incision had to  be extended proximally and distally a short ways in order for the bowel to  come through the opening once this was accomplished, the small bowel with  the diverticulum was  able to be raised up into the wound.  Sites were chosen  proximally and distally from the area where the diverticulum was for  division of the bowel.  The mesentery was opened sharply with the Bovie  electrocautery.  A GIA-55 stapler was then placed across the bowel at each  point, clamped and fired, thereby dividing the bowel between staple lines.  The mesentery to the small bowel and diverticulum in the area was then taken  down by serially clamping, dividing and ligating with 2-0 silk ties as well  as with 3-0 silk pop off stitches each of the mesenteric vessels in the  area.  Once this was accomplished, the antimesenteric edges of the proximal  and distal segment of bowel were reapproximated using a 3-0 silk stitch.  Enterotomies were made on the antimesenteric border of each of the bowel  segments.  A GIA-55 stapler was then opened and one limb of the stapler was  placed down one limb of the bowel and the other end was placed down the  other limb of the bowel.  The stapling device was then clamped along the  antimesenteric border of each of these two segments of bowel and fired  thereby creating a nice widely patent enteroenterostomy.  The anastomosis  was evaluated visually and found to be hemostatic.  The enterotomy site was  then closed with interrupted 3-0 silk stitches.  The mesenteric defect was  closed with 2-0 silk figure-of-eight sutures stitches.  The abdomen was then  irrigated with copious amounts of saline.  The fascia of the anterior  abdominal wall was closed with two running #1 PDS sutures and the  subcutaneous tissue was irrigated with copious amounts of saline.  The skin  was closed with staples.  The patient tolerated the procedure well.  At the  end of the case all sponge, needle and instrument counts were correct.  The  patient was awakened and taken to the recovery room in stable condition.                                                  Ollen Gross. Vernell Morgans,  M.D.  PST/MEDQ  D:  05/07/2003  T:  05/08/2003  Job:  [id number]

## 2011-05-01 NOTE — Discharge Summary (Signed)
NAME:  Christopher Hensley, Christopher Hensley                        ACCOUNT NO.:  [id number]   MEDICAL RECORD NO.:  [id number]                   PATIENT TYPE:  INP   LOCATION:  5037                                 FACILITY:  [facility]   PHYSICIAN:  Claude Manges. Cleophas Dunker, M.D.            DATE OF BIRTH:  [birth year]   DATE OF ADMISSION:  11/13/2003  DATE OF DISCHARGE:  11/18/2003                                 DISCHARGE SUMMARY   ADMITTING DIAGNOSES:  1. Bilateral end-stage osteoarthritis, left greater than right.  2. Hypertension.  3. History of gout.  4. History of anemia.  5. History of back pain.  6. History of Meckel's diverticulum.  7. History of prostate cancer.   DISCHARGE DIAGNOSES:  1. Status post left total knee arthroplasty.  2. Right knee osteoarthritis.  3. History of prostate cancer.  4. Hypertension.  5. History of gout.  6. History of anemia.  7. History of back pain.  8. History of Meckel's diverticulum.  9. Acute gout attack improved with colchicine and indomethacin.  10.      Fever, resolved, patient placed on Augmentin.   HISTORY OF PRESENT ILLNESS:  Mr. Shackleton is a 77 year old white male with  bilateral knee pain.  Left knee pain greater than right knee pain.  Left  knee pain has been ongoing for at least 5 years.  Right knee pain has been  an issue for approximately 1 year.  When asked to describe his pain he says  it just hurts.  The patient denies any radiation of pain.  Left knee hurts  mostly at the inferior pole of the patella.  Right knee pain is in the  medial compartment.  Most of his pain occurs with ambulation.  The pain does  not awaken him.  He denies any mechanical symptoms.  He has tried Celebrex  for pain relief with very little relief from pain. He gets better relief  with Advil which he is currently taking.  The patient has undergone 1  cortisone injection in the right knee.  This gave him good relief.  He uses  no assistive devices. The patient has failed  conservative treatment for the  left knee pain and will undergo a left total knee arthroplasty on November 13, 2003 at [facility].   ALLERGIES:  No known drug allergies.   MEDICATIONS:  1. j40 mg daily.  2. Lisinopril/HCTZ 20/25 mg daily.  3. Advil.  4. Multivitamin daily.  5. Vitamin C 1000 mg daily.  6. Amoxicillin started November 06, 2003.  7. Promethazine started November 06, 2003.  8. Iron.   SURGICAL PROCEDURE:  The patient was taken to the operating room on November 13, 2003 by Dr. Claude Manges. Whitfield assisted by Arlyn Leak, P.A.C.  The  patient was placed under general anesthesia and a left total knee  replacement was performed.  The following components were inserted:  Depuy  LCS large  femoral, #5 keel tibial platform with a 12.5 bridging bearing and  a metal back rotating 3 peg patella.  All components were secured using  polymethyl methacrylate.  The patient tolerated the procedure well and  returned to recovery in good stable condition.   CONSULTATION:  The following consults were obtained while the patient was  hospitalized:  PT and OT medicine, case management and pharmacy.   HOSPITAL COURSE:  The patient developed a fever that has resolved within 24  hours.  A urinalysis, urine culture, blood culture test, and portable x-ray  to rule out upper respiratory and urinary infections were all ordered.  The  patient also developed right hallux gout and was placed on colchicine and  indomethacin.  He developed hyperkalemia that resolved.  The patient was  discharged home on postoperative day 4, was afebrile and vital signs were  stable.   A CBC on admission: White blood count 6.5, hemoglobin 14.4, hematocrit 41.4,  platelets 157. Coagulations on admission:  PT 12.8, INR 0.9, PTT 30.  Routine chemistries on admission:  Sodium 139, potassium 3.0, chloride 99,  bicarb 34, glucose 94, BUN 20, creatinine 1.8.  Hepatic enzymes on  admission: AST 20, ALT 21, ALP 69  and total bilirubin was 0.8.  Urinalysis  was negative on admission.  An ESR drawn on November 16, 2003 was 113.   Left knee performed on November 13, 2003, AP and lateral showed a  satisfactory appearance of the left total knee replacement.  Chest x-ray  dated November 16, 2003 one view, AP portable chest showed no evidence of  acute pulmonary process.  Lungs were clear.  He had thickening of the left  inferolateral pleura which was unchanged from his film on November 07, 2003.   DISCHARGE MEDICATIONS:  1. The patient may resume preoperative meds except for no Advil while on     Coumadin.  2. The patient has had the following meds, Coumadin, from the pharmacy.  3. Percocet 5 mg 1-2 tabs every 4-6h. as needed for pain.  4. Augmentin 875 mg p.o. b.i.d. x8 days.   DISCHARGE INSTRUCTIONS:  1. Diet:  No restrictions.  2. Activity:  Left leg partial weight bearing, 50%, with walker; home     health; physical therapy; PT/INR with [facility] health.  3. Condition on discharge:  Patient is discharge home in good stable     condition.  4. Wound care:  The patient is to keep wound clean and dry.  May shower     after 2 days with no drainage from the wound site.  5. The patient is to call Dr. Hoy Register office at [phone] if any of the     following develop:  A temperature of greater than 101.5, chills,     swelling, foul smelling drainage from the wound, or pain is not     controlled with pain medication.  6. Special instructions:  CPM 0 to 90 degrees 6-8 hours a day, increase by     10 degrees daily.  7. Followup:  The patient needs an appointment with Dr. Cleophas Dunker in     approximately 10-12 days from discharge.     The patient is to call [phone] for an appointment.  8. The patient is to follow up with primary care physician on Wednesday,     November 21, 2003 to go over blood cultures and for further treatment.     Richardean Canal, P.A.  Claude Manges. Cleophas Dunker,  M.D.    GC/MEDQ  D:  01/14/2004  T:  01/15/2004  Job:  [id number]

## 2011-05-01 NOTE — Op Note (Signed)
NAME:  Christopher Hensley, Christopher Hensley                        ACCOUNT NO.:  [id number]   MEDICAL RECORD NO.:  [id number]                   PATIENT TYPE:  INP   LOCATION:  2894                                 FACILITY:  [facility]   PHYSICIAN:  Claude Manges. Cleophas Dunker, M.D.            DATE OF BIRTH:  02-Dec-1932   DATE OF PROCEDURE:  11/13/2003  DATE OF DISCHARGE:                                 OPERATIVE REPORT   PREOPERATIVE DIAGNOSIS:  End-stage osteoarthritis, left knee.   POSTOPERATIVE DIAGNOSIS:  End-stage osteoarthritis, left knee.   PROCEDURE:  Left total knee replacement.   SURGEON:  Claude Manges. Cleophas Dunker, M.D.   ASSISTANT:  Jamelle Rushing, P.A.   ANESTHESIA:  General orotracheal.  A supplemental femoral nerve block was  performed by Burna Forts, M.D., postoperatively.   COMPLICATIONS:  None.   COMPONENTS:  DePuy LCS large femoral #5 rotating keel tibial platform with a  12.5 mm bridging bearing and a metal-backed rotating three-peg patella.  Tibial, femoral, and patella components were secured with polymethyl  methacrylate.   DESCRIPTION OF PROCEDURE:  With the patient comfortable on the operating  table and under general orotracheal anesthesia, nursing staff inserted the  Foley catheter without difficulty.  A tourniquet was applied to the left  lower extremity.  The left lower extremity was then prepped with Betadine  scrub and then Duraprep from the tourniquet to the midfoot.  Sterile draping  was performed.  With the extremity still elevated it was Esmarch-  exsanguinated with a proximal tourniquet at 350 mmHg.   A midline longitudinal incision was made centered about the patella,  extending from the superior pouch to the tibial tubercle.  Via sharp  dissection the incision was carried down through the subcutaneous tissue.  The first layer of capsule was incised in the midline.  A medial  parapatellar incision was then made with the Bovie.  There was a clear  yellow joint effusion.   The patella was everted 180 degrees, the knee flexed  to 90 degrees.  There were large osteophytes along the medial and lateral  femoral condyle.  There was considerable chondromalacia of the patella and  little, if any, cartilage remaining in the medial compartment, that is, the  femoral condyle and tibial plateau.  There were osteophytes along the medial  tibial plateau.  There were also large osteophytes on the posterior femoral  condyles, and these were removed with an osteotome.   Preoperatively we had measured a large femoral component.  This was  confirmed intraoperatively.  We also confirmed the #5 rotating keeled tibial  tray.   The initial cut was made transversely on the tibia with a 12.5 mm gap  providing equality of ligament tension medially and laterally.  Second cuts  were then made on the femur.  The lamina spreaders were inserted and both  medial and lateral compartments were removed of medial and lateral menisci  and any remnants  of ACL and PCL, which were both sacrificed.  Again  osteophytes were removed from the femoral condyles medially and laterally,  and osteophytes were also removed from the medial tibial plateau.  There was  a large fabella posterolaterally, but I left this in place.  It did not  appear to be impinging.  A 12.5 mm extension gap was symmetrical.  A 4  degree distal femoral valgus cut was utilized and a 7 degree posterior angle  inclination on the tibia.  Final cuts were then made on the tibia to accept  the keeled tray.  The trial components were inserted and placed through a  full range of motion without any malrotation of the tibial component.  There  was no opening with a varus or valgus stress.  The patella was prepared by  removing 12 mm of bone and leaving 12 mm of patella thickness with three  pegs.  The three peg holes were then drilled and the trial component  applied, the knee placed through the full range of motion without any   subluxation of the patella.   Trial components were removed.  The joint was copiously irrigated with jet  saline.  Each of the final components were then secured with polymethyl  methacrylate with excellent fit.  Extraneous methacrylate was removed with  Michaelle Copas.  The patella was applied with a patella clamp.  After maturation of  the methacrylate, the joint was explored.  Any trace methacrylate was  removed with an osteotome.  The joint was then again irrigated with saline  antibiotic solution.  The tourniquet was deflated, gross bleeders were Bovie  coagulated.  A Hemovac was not necessary.  The deep capsule was closed with  interrupted #1 Ethibond, the superficial capsule closed with running 0  Vicryl, the subcu was closed with  2-0 Vicryl, skin closed with skin clips.  A sterile bulky dressing was applied, followed by the patient's support  stocking.   There were good pulses.   The patient tolerated the procedure well without complications.                                               Claude Manges. Cleophas Dunker, M.D.    PWW/MEDQ  D:  11/13/2003  T:  11/13/2003  Job:  [id number]

## 2011-08-26 ENCOUNTER — Other Ambulatory Visit: Payer: Self-pay | Admitting: Geriatric Medicine

## 2011-08-26 DIAGNOSIS — D170 Benign lipomatous neoplasm of skin and subcutaneous tissue of head, face and neck: Secondary | ICD-10-CM

## 2011-08-28 ENCOUNTER — Ambulatory Visit
Admission: RE | Admit: 2011-08-28 | Discharge: 2011-08-28 | Disposition: A | Discharge status: Home / Self Care | Payer: Medicare Other | Source: Ambulatory Visit | Attending: Geriatric Medicine | Admitting: Geriatric Medicine

## 2011-08-28 DIAGNOSIS — D170 Benign lipomatous neoplasm of skin and subcutaneous tissue of head, face and neck: Secondary | ICD-10-CM

## 2011-09-17 ENCOUNTER — Other Ambulatory Visit (HOSPITAL_COMMUNITY): Payer: Self-pay | Admitting: Orthopedic Surgery

## 2011-09-17 ENCOUNTER — Encounter (HOSPITAL_COMMUNITY)
Admission: RE | Admit: 2011-09-17 | Discharge: 2011-09-17 | Disposition: A | Discharge status: Home / Self Care | Payer: Medicare Other | Source: Ambulatory Visit | Attending: Orthopedic Surgery | Admitting: Orthopedic Surgery

## 2011-09-17 ENCOUNTER — Ambulatory Visit (HOSPITAL_COMMUNITY)
Admission: RE | Admit: 2011-09-17 | Discharge: 2011-09-17 | Disposition: A | Discharge status: Home / Self Care | Payer: Medicare Other | Source: Ambulatory Visit | Attending: Orthopedic Surgery | Admitting: Orthopedic Surgery

## 2011-09-17 DIAGNOSIS — I10 Essential (primary) hypertension: Secondary | ICD-10-CM | POA: Insufficient documentation

## 2011-09-17 DIAGNOSIS — I498 Other specified cardiac arrhythmias: Secondary | ICD-10-CM | POA: Insufficient documentation

## 2011-09-17 DIAGNOSIS — E119 Type 2 diabetes mellitus without complications: Secondary | ICD-10-CM | POA: Insufficient documentation

## 2011-09-17 DIAGNOSIS — I4949 Other premature depolarization: Secondary | ICD-10-CM | POA: Insufficient documentation

## 2011-09-17 DIAGNOSIS — Z01811 Encounter for preprocedural respiratory examination: Secondary | ICD-10-CM

## 2011-09-17 LAB — CBC
Hemoglobin: 13.2 g/dL (ref 13.0–17.0)
MCH: 32.4 pg (ref 26.0–34.0)
Platelets: 165 10*3/uL (ref 150–400)
RBC: 4.07 MIL/uL — ABNORMAL LOW (ref 4.22–5.81)
WBC: 7.1 10*3/uL (ref 4.0–10.5)

## 2011-09-17 LAB — SURGICAL PCR SCREEN
MRSA, PCR: NEGATIVE
Staphylococcus aureus: NEGATIVE

## 2011-09-17 LAB — URINALYSIS, ROUTINE W REFLEX MICROSCOPIC
Bilirubin Urine: NEGATIVE
Glucose, UA: NEGATIVE mg/dL
Hgb urine dipstick: NEGATIVE
Ketones, ur: NEGATIVE mg/dL
Protein, ur: NEGATIVE mg/dL
Urobilinogen, UA: 0.2 mg/dL (ref 0.0–1.0)

## 2011-09-17 LAB — COMPREHENSIVE METABOLIC PANEL
BUN: 19 mg/dL (ref 6–23)
CO2: 28 mEq/L (ref 19–32)
Chloride: 107 mEq/L (ref 96–112)
Creatinine, Ser: 1.74 mg/dL — ABNORMAL HIGH (ref 0.50–1.35)
GFR calc Af Amer: 42 mL/min — ABNORMAL LOW (ref >90)
GFR calc non Af Amer: 36 mL/min — ABNORMAL LOW (ref >90)
Glucose, Bld: 79 mg/dL (ref 70–99)
Total Bilirubin: 0.3 mg/dL (ref 0.3–1.2)

## 2011-09-17 LAB — DIFFERENTIAL
Basophils Relative: 0 % (ref 0–1)
Eosinophils Absolute: 0.2 10*3/uL (ref 0.0–0.7)
Lymphs Abs: 2 10*3/uL (ref 0.7–4.0)
Monocytes Relative: 7 % (ref 3–12)
Neutro Abs: 4.5 10*3/uL (ref 1.7–7.7)
Neutrophils Relative %: 63 % (ref 43–77)

## 2011-09-17 LAB — TYPE AND SCREEN
ABO/RH(D): A POS
Antibody Screen: NEGATIVE

## 2011-09-17 LAB — PROTIME-INR: Prothrombin Time: 14.1 seconds (ref 11.6–15.2)

## 2011-09-17 LAB — APTT: aPTT: 32 seconds (ref 24–37)

## 2011-09-17 LAB — URINE MICROSCOPIC-ADD ON

## 2011-09-18 LAB — URINE CULTURE: Culture: NO GROWTH

## 2011-09-19 DIAGNOSIS — F32A Depression, unspecified: Secondary | ICD-10-CM

## 2011-09-19 HISTORY — DX: Depression, unspecified: F32.A

## 2011-09-28 ENCOUNTER — Inpatient Hospital Stay (HOSPITAL_COMMUNITY)
Admission: RE | Admit: 2011-09-28 | Discharge: 2011-09-30 | DRG: 470 | Disposition: A | Discharge status: Home Health | Payer: Medicare Other | Source: Ambulatory Visit | Attending: Orthopedic Surgery | Admitting: Orthopedic Surgery

## 2011-09-28 DIAGNOSIS — IMO0002 Reserved for concepts with insufficient information to code with codable children: Principal | ICD-10-CM | POA: Diagnosis present

## 2011-09-28 DIAGNOSIS — D62 Acute posthemorrhagic anemia: Secondary | ICD-10-CM | POA: Diagnosis not present

## 2011-09-28 DIAGNOSIS — F17200 Nicotine dependence, unspecified, uncomplicated: Secondary | ICD-10-CM | POA: Diagnosis present

## 2011-09-28 DIAGNOSIS — M1710 Unilateral primary osteoarthritis, unspecified knee: Principal | ICD-10-CM | POA: Diagnosis present

## 2011-09-28 HISTORY — PX: JOINT REPLACEMENT: SHX530

## 2011-09-28 LAB — GLUCOSE, CAPILLARY
Glucose-Capillary: 95 mg/dL (ref 70–99)
Glucose-Capillary: 112 mg/dL — ABNORMAL HIGH (ref 70–99)

## 2011-09-29 LAB — BASIC METABOLIC PANEL
CO2: 28 mEq/L (ref 19–32)
GFR calc non Af Amer: 44 mL/min — ABNORMAL LOW (ref >90)
Glucose, Bld: 110 mg/dL — ABNORMAL HIGH (ref 70–99)
Potassium: 3.4 mEq/L — ABNORMAL LOW (ref 3.5–5.1)
Sodium: 138 mEq/L (ref 135–145)

## 2011-09-29 LAB — GLUCOSE, CAPILLARY: Glucose-Capillary: 98 mg/dL (ref 70–99)

## 2011-09-29 LAB — CBC
Hemoglobin: 9.4 g/dL — ABNORMAL LOW (ref 13.0–17.0)
MCH: 32 pg (ref 26.0–34.0)
MCV: 95.2 fL (ref 78.0–100.0)
RBC: 2.94 MIL/uL — ABNORMAL LOW (ref 4.22–5.81)
WBC: 10.4 10*3/uL (ref 4.0–10.5)

## 2011-09-29 LAB — HEMOGLOBIN A1C
Hgb A1c MFr Bld: 5.7 % — ABNORMAL HIGH (ref <5.7)
Mean Plasma Glucose: 117 mg/dL — ABNORMAL HIGH (ref <117)

## 2011-09-30 LAB — GLUCOSE, CAPILLARY: Glucose-Capillary: 121 mg/dL — ABNORMAL HIGH (ref 70–99)

## 2011-09-30 LAB — BASIC METABOLIC PANEL
BUN: 23 mg/dL (ref 6–23)
CO2: 27 mEq/L (ref 19–32)
Chloride: 103 mEq/L (ref 96–112)
Glucose, Bld: 109 mg/dL — ABNORMAL HIGH (ref 70–99)
Potassium: 3.3 mEq/L — ABNORMAL LOW (ref 3.5–5.1)
Sodium: 138 mEq/L (ref 135–145)

## 2011-09-30 LAB — CBC
HCT: 26.2 % — ABNORMAL LOW (ref 39.0–52.0)
Hemoglobin: 9 g/dL — ABNORMAL LOW (ref 13.0–17.0)
MCHC: 34.4 g/dL (ref 30.0–36.0)
RBC: 2.79 MIL/uL — ABNORMAL LOW (ref 4.22–5.81)

## 2011-10-02 ENCOUNTER — Inpatient Hospital Stay (HOSPITAL_COMMUNITY)
Admission: RE | Admit: 2011-10-02 | Discharge: 2011-10-07 | DRG: 554 | Disposition: A | Discharge status: Skilled Nursing Facility | Payer: Medicare Other | Source: Ambulatory Visit | Attending: Orthopedic Surgery | Admitting: Orthopedic Surgery

## 2011-10-02 DIAGNOSIS — R197 Diarrhea, unspecified: Secondary | ICD-10-CM | POA: Diagnosis not present

## 2011-10-02 DIAGNOSIS — E876 Hypokalemia: Secondary | ICD-10-CM | POA: Diagnosis present

## 2011-10-02 DIAGNOSIS — D62 Acute posthemorrhagic anemia: Secondary | ICD-10-CM | POA: Diagnosis present

## 2011-10-02 DIAGNOSIS — Z96659 Presence of unspecified artificial knee joint: Secondary | ICD-10-CM

## 2011-10-02 DIAGNOSIS — M1710 Unilateral primary osteoarthritis, unspecified knee: Secondary | ICD-10-CM | POA: Diagnosis present

## 2011-10-02 DIAGNOSIS — Z7901 Long term (current) use of anticoagulants: Secondary | ICD-10-CM

## 2011-10-02 DIAGNOSIS — Z8546 Personal history of malignant neoplasm of prostate: Secondary | ICD-10-CM

## 2011-10-02 DIAGNOSIS — Z79899 Other long term (current) drug therapy: Secondary | ICD-10-CM

## 2011-10-02 DIAGNOSIS — F411 Generalized anxiety disorder: Secondary | ICD-10-CM | POA: Diagnosis present

## 2011-10-02 DIAGNOSIS — Z7982 Long term (current) use of aspirin: Secondary | ICD-10-CM

## 2011-10-02 DIAGNOSIS — Z923 Personal history of irradiation: Secondary | ICD-10-CM

## 2011-10-02 DIAGNOSIS — N189 Chronic kidney disease, unspecified: Secondary | ICD-10-CM | POA: Diagnosis present

## 2011-10-02 DIAGNOSIS — R0789 Other chest pain: Secondary | ICD-10-CM | POA: Diagnosis present

## 2011-10-02 DIAGNOSIS — I129 Hypertensive chronic kidney disease with stage 1 through stage 4 chronic kidney disease, or unspecified chronic kidney disease: Secondary | ICD-10-CM | POA: Diagnosis present

## 2011-10-02 DIAGNOSIS — M109 Gout, unspecified: Principal | ICD-10-CM | POA: Diagnosis present

## 2011-10-02 DIAGNOSIS — E119 Type 2 diabetes mellitus without complications: Secondary | ICD-10-CM | POA: Diagnosis present

## 2011-10-02 LAB — GLUCOSE, CAPILLARY: Glucose-Capillary: 107 mg/dL — ABNORMAL HIGH (ref 70–99)

## 2011-10-03 ENCOUNTER — Other Ambulatory Visit: Payer: Self-pay

## 2011-10-03 DIAGNOSIS — R079 Chest pain, unspecified: Secondary | ICD-10-CM

## 2011-10-03 LAB — PROTIME-INR
INR: 1.22 (ref 0.00–1.49)
Prothrombin Time: 15.7 seconds — ABNORMAL HIGH (ref 11.6–15.2)

## 2011-10-03 LAB — COMPREHENSIVE METABOLIC PANEL
ALT: 16 U/L (ref 0–53)
AST: 22 U/L (ref 0–37)
Albumin: 2.4 g/dL — ABNORMAL LOW (ref 3.5–5.2)
CO2: 22 mEq/L (ref 19–32)
Calcium: 8.4 mg/dL (ref 8.4–10.5)
Creatinine, Ser: 1.66 mg/dL — ABNORMAL HIGH (ref 0.50–1.35)
GFR calc non Af Amer: 38 mL/min — ABNORMAL LOW (ref >90)
Sodium: 138 mEq/L (ref 135–145)
Total Protein: 5.9 g/dL — ABNORMAL LOW (ref 6.0–8.3)

## 2011-10-03 LAB — DIFFERENTIAL
Basophils Absolute: 0 K/uL (ref 0.0–0.1)
Basophils Relative: 0 % (ref 0–1)
Basophils Relative: 0 % (ref 0–1)
Eosinophils Absolute: 0.3 K/uL (ref 0.0–0.7)
Eosinophils Relative: 4 % (ref 0–5)
Lymphocytes Relative: 12 % (ref 12–46)
Lymphocytes Relative: 14 % (ref 12–46)
Lymphs Abs: 1.1 K/uL (ref 0.7–4.0)
Monocytes Absolute: 0.7 K/uL (ref 0.1–1.0)
Monocytes Relative: 8 % (ref 3–12)
Monocytes Relative: 11 % (ref 3–12)
Neutro Abs: 7.1 K/uL (ref 1.7–7.7)
Neutro Abs: 5.1 10*3/uL (ref 1.7–7.7)
Neutrophils Relative %: 77 % (ref 43–77)
Neutrophils Relative %: 71 % (ref 43–77)

## 2011-10-03 LAB — CBC
HCT: 22 % — ABNORMAL LOW (ref 39.0–52.0)
Hemoglobin: 7.6 g/dL — ABNORMAL LOW (ref 13.0–17.0)
MCH: 31.8 pg (ref 26.0–34.0)
Platelets: 202 10*3/uL (ref 150–400)
RBC: 1.88 MIL/uL — ABNORMAL LOW (ref 4.22–5.81)
RBC: 2.39 MIL/uL — ABNORMAL LOW (ref 4.22–5.81)
RDW: 15.3 % (ref 11.5–15.5)
WBC: 9.2 10*3/uL (ref 4.0–10.5)

## 2011-10-03 LAB — CK TOTAL AND CKMB (NOT AT ARMC)
CK, MB: 1.9 ng/mL (ref 0.3–4.0)
Total CK: 41 U/L (ref 7–232)

## 2011-10-03 LAB — GLUCOSE, CAPILLARY
Glucose-Capillary: 76 mg/dL (ref 70–99)
Glucose-Capillary: 122 mg/dL — ABNORMAL HIGH (ref 70–99)
Glucose-Capillary: 59 mg/dL — ABNORMAL LOW (ref 70–99)

## 2011-10-03 LAB — APTT: aPTT: 42 seconds — ABNORMAL HIGH (ref 24–37)

## 2011-10-03 LAB — TROPONIN I: Troponin I: <0.3 ng/mL (ref <0.30)

## 2011-10-04 LAB — TYPE AND SCREEN
ABO/RH(D): A POS
Antibody Screen: NEGATIVE
Unit division: 0

## 2011-10-04 LAB — BASIC METABOLIC PANEL
BUN: 33 mg/dL — ABNORMAL HIGH (ref 6–23)
CO2: 25 mEq/L (ref 19–32)
Calcium: 8.2 mg/dL — ABNORMAL LOW (ref 8.4–10.5)
Creatinine, Ser: 1.73 mg/dL — ABNORMAL HIGH (ref 0.50–1.35)
Glucose, Bld: 99 mg/dL (ref 70–99)

## 2011-10-04 LAB — CBC
Hemoglobin: 9.7 g/dL — ABNORMAL LOW (ref 13.0–17.0)
MCHC: 33.9 g/dL (ref 30.0–36.0)
RBC: 3.12 MIL/uL — ABNORMAL LOW (ref 4.22–5.81)

## 2011-10-04 LAB — CK TOTAL AND CKMB (NOT AT ARMC)
CK, MB: 1.8 ng/mL (ref 0.3–4.0)
Total CK: 37 U/L (ref 7–232)

## 2011-10-04 LAB — GLUCOSE, CAPILLARY
Glucose-Capillary: 98 mg/dL (ref 70–99)
Glucose-Capillary: 77 mg/dL (ref 70–99)
Glucose-Capillary: 92 mg/dL (ref 70–99)

## 2011-10-05 LAB — BASIC METABOLIC PANEL
CO2: 24 mEq/L (ref 19–32)
Chloride: 107 mEq/L (ref 96–112)
GFR calc Af Amer: 50 mL/min — ABNORMAL LOW (ref >90)
Potassium: 4.2 mEq/L (ref 3.5–5.1)
Sodium: 142 mEq/L (ref 135–145)

## 2011-10-05 LAB — GLUCOSE, CAPILLARY
Glucose-Capillary: 92 mg/dL (ref 70–99)
Glucose-Capillary: 124 mg/dL — ABNORMAL HIGH (ref 70–99)
Glucose-Capillary: 77 mg/dL (ref 70–99)

## 2011-10-06 LAB — GLUCOSE, CAPILLARY
Glucose-Capillary: 100 mg/dL — ABNORMAL HIGH (ref 70–99)
Glucose-Capillary: 133 mg/dL — ABNORMAL HIGH (ref 70–99)

## 2011-10-06 NOTE — Op Note (Signed)
Christopher Hensley, Christopher Hensley              ACCOUNT NO.:  [id number]  MEDICAL RECORD NO.:  [id number]  LOCATION:  5036                         FACILITY:  [facility]  PHYSICIAN:  Mila Homer. Sherlean Foot, M.D. DATE OF BIRTH:  [birth year]  DATE OF PROCEDURE:  09/28/2011 DATE OF DISCHARGE:  09/30/2011                              OPERATIVE REPORT   SURGEON:  Mila Homer. Sherlean Foot, MD  ASSISTANT:  Altamese Cabal, PA-C  ANESTHESIA:  General.  PREOPERATIVE DIAGNOSIS:  Right knee osteoarthritis.  POSTOPERATIVE DIAGNOSIS:  Right knee osteoarthritis.  PROCEDURE:  Right total knee arthroplasty.  INDICATION FOR PROCEDURE:  The patient is a 77 year old white male with failure of conservative measures, right knee osteoarthritis.  DESCRIPTION OF PROCEDURE:  The patient was laid supine and administered general anesthesia.  The right leg prepped and draped in usual sterile fashion.  The extremity was exsanguinated with the Esmarch.  Tourniquet inflated to 350 mmHg and set for an hour.  Midline incision was made with a #10 blade.  New blade was used to make a median parapatellar arthrotomy and perform a synovectomy.  The deep MCL was then reflected off the medial crest of the tibia around to and through the semimembranosus tendon.  The patient did have a flexion contracture and varus deformity.  We then everted the patella, measured 25 mm thick.  I reamed down 8.5 mm and drilled through lug holes with 35-mm template.  I recreated the native thickness.  I removed the prosthetic trial, subluxed the patella laterally, and went into flexion.  I used the extramedullary alignment system on the tibia to make permanent cut through the anatomic axis of the tibia and 3-degree posterior slope.  I used the intramedullary alignment system on the femur to make a 6-degree valgus cut since this was a varus knee.  I then marked out the epicondylar axis which measured 3 degrees.  I sized to size E, pinned through the 3-degree external  rotation holes.  I made the anterior, posterior, and chamfer cuts through the 4-in-1 cutting block.  I then placed a lamina spreader in the knee and removed the ACL, PCL, medial, and lateral menisci, and posterior condylar osteophytes.  I then placed a 10-mm spacer block in the knee and had to do some medial releasing and ultimately got to a 14 spacer block with good flexion/tension gap balance.  I finished the femur with a E finishing block, tibia with a 6 tibial tray drilling keel.  I then trialed with the E femur, 6 tibia, 14 insert, and 35 patella, had good flexion/extension gap balance and good patellar tracking.  I then chose these components, removed the trials and copiously irrigated.  I then cemented the components removing all of excess cement allowing the cement to harden in extension.  At this point, I let the tourniquet down at 50 minutes, obtained hemostasis, copiously irrigated again.  Left a Hemovac coming out superolaterally and deep to the arthrotomy and pain catheter coming out superomedially and superficial to the arthrotomy.  I closed the arthrotomy with figure- of-eight #1 Vicryl sutures, buried 0-Vicryl sutures, subcuticular 2-0 Vicryl stitches, and skin staples.  Dressed with Xeroform dressing, sponges, sterile Webril, and Ace wrap.  COMPLICATIONS:  None.  DRAINS:  One Hemovac and one pain catheter.  ESTIMATED BLOOD LOSS:  100 mL.          ______________________________ Mila Homer. Sherlean Foot, M.D.     SDL/MEDQ  D:  10/01/2011  T:  10/01/2011  Job:  [id number]  Electronically Signed by Georgena Spurling M.D. on 10/06/2011 12:03:44 PM

## 2011-10-08 NOTE — Consult Note (Signed)
NAMEMIKIE, MISNER              ACCOUNT NO.:  [id number]  MEDICAL RECORD NO.:  [id number]  LOCATION:  5008                         FACILITY:  [facility]  PHYSICIAN:  Vesta Mixer, M.D. DATE OF BIRTH:  [birth year]  DATE OF CONSULTATION: DATE OF DISCHARGE:                                CONSULTATION   REFERRING PHYSICIAN:  Dr. Erasmo Leventhal.  REASON FOR CONSULTATION:  Chest pain.  Mr. Bergfeld is a 77 year old male, with history of normal coronary arteries, by cardiac catheterization in 2005, but with numerous cardiac risk factors, including diabetes mellitus, now referred to Dr. Elease Hashimoto for evaluation of chest pain.  The patient was readmitted yesterday, directly from Dr. Sable Feil office, in followup of recent right total knee replacement.  There was concern that his swollen knee was infected, and he was directly admitted.  Earlier today, the patient reported some chest pain, and we are now asked to evaluate further.  The patient suggests a longstanding history of chest discomfort, with no strict correlation to activity or exertion.  In fact, he states that he has this "all the time", essentially on a daily basis.  This can occur several times a day, typically lasting 5-10 minutes in duration.  He is quite definite that it is all related to "anxiety", and whenever he is feeling considerable stress.  Cardiac markers have been drawn, but are currently pending.  Serial EKGs from yesterday indicate normal sinus rhythm with nonspecific ST abnormalities.  The patient did have a pharmacologic nuclear imaging study, December 2010, reviewed by Dr. Jens Som, yielding no evidence of  infarction/ischemia; EF 64%, with normal wall motion.  Cardiac catheterization was performed in September 2005, by Dr. Susa Griffins, revealing essentially normal coronary arteries and left ventricular function.  At that time, he also performed an abdominal angiogram, again revealing no evidence of  significant atherosclerosis or infrarenal abdominal aneurysm.  ALLERGIES:  No known drug allergies.  HOME MEDICATIONS: 1. Glipizide 5 daily. 2. Torsemide 20 daily. 3. Lisinopril 10 daily. 4. Allopurinol 100 daily. 5. Colchicine 0.6 p.r.n. 6. Aspirin 81 daily. 7. Lovenox 40 daily. 8. Robaxin. 9. Celebrex. 10.OxyContin.  PAST MEDICAL HISTORY: 1. Hypertension. 2. Diabetes mellitus. 3. Bilateral knee osteoarthritis.     a.     Status post right TKR, September 28, 2011; status post left TKR,      2004. 4. Prostate cancer.     a.     Status post radiation treatment. 5. Normocytic anemia. 6. Gout. 7. Chronic kidney disease.  SOCIAL HISTORY:  Negative for tobacco, with remote history of cigar smoking.  Negative alcohol use.  FAMILY HISTORY:  Father deceased at age 58, history of "heart problems."  REVIEW OF SYSTEMS:  Denies history of exertional chest pain or dyspnea. Denies history of hyperlipidemia.  Denies symptoms suggestive of active reflux disease.  Remaining systems reviewed and are negative.  PHYSICAL EXAMINATION:  VITAL SIGNS:  Blood pressure currently 190/88, pulse 90 and regular, respirations 22, temperature afebrile, sats 98% on room air. GENERAL:  A 77 year old male lying supine, no distress. HEENT:  Normocephalic, atraumatic.  PERRLA.  EOMI. NECK:  Palpable bilateral carotid pulses without bruits; jugular venous distention noted on the right, at  the angle of jaw. LUNGS:  Clear to auscultation bilaterally. HEART:  Regular rate and rhythm.  No significant murmurs.  No rubs. ABDOMEN:  Protuberant, intact bowel sounds. EXTREMITIES:  Palpable dorsalis pedis pulses with trace right lower extremity edema; swollen right knee with well-healed incision, no erythema or oozing. SKIN:  Warm and dry. MUSCULOSKELETAL:  Status post right knee surgery. NEURO:  Alert and oriented.  ADMISSION EKG:  Normal sinus rhythm at 97 bpm; normal axis; no ischemic changes.  LABORATORY  DATA:  WBC 7.2, hemoglobin 7.6, platelets 165,000.  Sodium 138, potassium 3.1 BUN 32, creatinine 1.7 (GFR 38) and glucose 160, albumin 2.4, INR 1.2.  Blood culture is pending.  IMPRESSION: 1. Atypical chest pain.     a.     Normal coronary arteries, by catheterization in 2005.     b.     Negative adenosine stress Myoview; ejection fraction 64%,      December 2010. 2. Status post right total knee replacement.     a.     Postop day #5. 3. Hypertension, controlled. 4. Diabetes mellitus. 5. Renal insufficiency. 6. Anemia.     a.     Status post 2 unit PRBC transfusion. 7. Hypokalemia. 8. Remote tobacco.  PLAN: 1. Continue cycling cardiac markers to rule out myocardial ischemia.     Further recommendations to follow, pending review of this result.     Of note, given that his symptoms are, in fact, quite atypical, and     that he had a negative stress test nearly 2 years ago, we do not feel      that he needs a repeat study, if cardiac markers are negative. 2. Recommend addition of a beta-blocker for management of     hypertension, in the context of significant anxiety and stress.  We     will start carvedilol 6.25 mg b.i.d., with initial dose this     evening. 3. Agree with temporary cessation of aspirin, in the setting of anemia     requiring RBC transfusion.  Can resume as an outpatient, once     cleared from a surgical standpoint. 4. Given his history of diabetes, recommend addition of a statin for     primary prophylaxis.  We will defer to his primary care physician,     regarding this issue.     Gene Serpe, PA-C   ______________________________ Vesta Mixer, M.D.    GS/MEDQ  D:  10/03/2011  T:  10/03/2011  Job:  [id number]  Electronically Signed by Rozell Searing PA-C on 10/05/2011 05:07:30 PM Electronically Signed by Kristeen Miss M.D. on 10/08/2011 05:42:34 AM

## 2011-10-09 LAB — CULTURE, BLOOD (ROUTINE X 2)
Culture  Setup Time: 201210200318
Culture: NO GROWTH

## 2011-10-13 NOTE — Discharge Summary (Signed)
Christopher Hensley, Christopher Hensley              ACCOUNT NO.:  [id number]  MEDICAL RECORD NO.:  [id number]  LOCATION:  5008                         FACILITY:  [facility]  PHYSICIAN:  Mila Homer. Sherlean Foot, M.D. DATE OF BIRTH:  06-Aug-1932  DATE OF ADMISSION:  10/02/2011 DATE OF DISCHARGE:  10/05/2011                              DISCHARGE SUMMARY   ADMISSION DIAGNOSES: 1. Atypical chest pain. 2. Status post right total knee replacement postop day #5. 3. Hypertension. 4. Diabetes. 5. Renal insufficiency. 6. Anemia. 7. Hypokalemia.  DISCHARGE DIAGNOSES: 1. Status post right total knee replacement. 2. Atypical chest pain. 3. Hypertension. 4. Diabetes. 5. Renal insufficiency. 6. Anemia status post surgery.  PROCEDURE:  No new procedure was done while in the hospital.  The patient is status post right total knee replacement.  HISTORY:  The patient is a 77 year old male with a history of normal coronary arteries by cardiac catheterization, numerous cardiac risk factors including diabetes and hypertension.  The patient was readmitted directly from Dr. Sable Feil office.  Followup recent right total knee replacement, there was concern of swollen knee and was directed immediately.  Earlier in the day, the patient reported some chest pain and was asked to further evaluate.  He has consulted with cardiac at that time.  The patient did report a fall while he was at home.  While in the hospital, the patient was noted to have low PRBCs and was given a blood transfusion.  The patient was continued on physical therapy.  The patient was continued on CPM 0-90, 6-8 hours was also given Valium for anxiety and was on Norco for pain.  It was thought that he was agitated home on oxycodone and was not given that while in the hospital.  ALLERGIES:  OXYCODONE.  No other known drug allergies.  Upon admission home medications were, 1. Glipizide 5 daily. 2. Torsemide 20 daily. 3. Lisinopril 10 daily. 4. Allopurinol  100 daily as needed. 5. Colchicine 0.6 p.r.n. 6. Aspirin 81 mg daily. 7. Lovenox 40 mg daily for DVT to be stopped on the 29th. 8. Celebrex 200 mg twice daily. 9. OxyContin before re-admit.  The patient's labs are largely normalized after given blood, had no evidence of high blood pressure.  Was given all his pain medication, has done well while in the hospital.  The patient was discharged to a skilled nursing facility after being restarted on his Lovenox.  It was stopped because of low blood during the weekend.  DISCHARGE INSTRUCTIONS:  No restrictions to diet.  The instruction sheet included, increase activity slowly.  May use a cane or walker, weightbearing as tolerated.  No lifting or driving for 6 weeks.  Home health should be established with [facility] once leaving the skilled nursing facility.  Upon discharge from the hospital, the patient was given prescription for Lovenox 40 mg inject 1 subcutaneously, last dose should be October 12, 2011, Robaxin 500 mg 1-2 every 6-8 hours as needed for spasm, Norco 5/325 one to two tabs every 4-6 hours as needed for pain, and Valium 5 mg 1 tab every 6 hours as needed for anxiety.  The patient will follow with Dr. Sherlean Foot on October 13, 2011.  Call for  appointment [phone].  The patient is discharged in improved condition.    ______________________________ Altamese Cabal, PA-C   ______________________________ Mila Homer. Sherlean Foot, M.D.    MJ/MEDQ  D:  10/06/2011  T:  10/06/2011  Job:  [id number]  Electronically Signed by Altamese Cabal PA-C on 10/07/2011 02:06:47 PM Electronically Signed by Georgena Spurling M.D. on 10/13/2011 05:26:59 PM

## 2011-10-20 ENCOUNTER — Inpatient Hospital Stay (HOSPITAL_COMMUNITY)
Admission: AD | Admit: 2011-10-20 | Discharge: 2011-10-22 | DRG: 560 | Disposition: A | Discharge status: Skilled Nursing Facility | Payer: Medicare Other | Source: Ambulatory Visit | Attending: Orthopedic Surgery | Admitting: Orthopedic Surgery

## 2011-10-20 ENCOUNTER — Encounter (HOSPITAL_COMMUNITY): Payer: Self-pay | Admitting: General Practice

## 2011-10-20 DIAGNOSIS — Z79899 Other long term (current) drug therapy: Secondary | ICD-10-CM

## 2011-10-20 DIAGNOSIS — T8450XA Infection and inflammatory reaction due to unspecified internal joint prosthesis, initial encounter: Principal | ICD-10-CM | POA: Diagnosis present

## 2011-10-20 DIAGNOSIS — M109 Gout, unspecified: Secondary | ICD-10-CM | POA: Diagnosis present

## 2011-10-20 DIAGNOSIS — I129 Hypertensive chronic kidney disease with stage 1 through stage 4 chronic kidney disease, or unspecified chronic kidney disease: Secondary | ICD-10-CM | POA: Diagnosis present

## 2011-10-20 DIAGNOSIS — N189 Chronic kidney disease, unspecified: Secondary | ICD-10-CM

## 2011-10-20 DIAGNOSIS — Z8546 Personal history of malignant neoplasm of prostate: Secondary | ICD-10-CM

## 2011-10-20 DIAGNOSIS — E119 Type 2 diabetes mellitus without complications: Secondary | ICD-10-CM | POA: Diagnosis present

## 2011-10-20 DIAGNOSIS — Z96659 Presence of unspecified artificial knee joint: Secondary | ICD-10-CM

## 2011-10-20 DIAGNOSIS — Y831 Surgical operation with implant of artificial internal device as the cause of abnormal reaction of the patient, or of later complication, without mention of misadventure at the time of the procedure: Secondary | ICD-10-CM | POA: Diagnosis present

## 2011-10-20 DIAGNOSIS — D62 Acute posthemorrhagic anemia: Secondary | ICD-10-CM | POA: Diagnosis present

## 2011-10-20 DIAGNOSIS — Z7982 Long term (current) use of aspirin: Secondary | ICD-10-CM

## 2011-10-20 DIAGNOSIS — G4730 Sleep apnea, unspecified: Secondary | ICD-10-CM

## 2011-10-20 DIAGNOSIS — J3489 Other specified disorders of nose and nasal sinuses: Secondary | ICD-10-CM | POA: Diagnosis present

## 2011-10-20 DIAGNOSIS — F341 Dysthymic disorder: Secondary | ICD-10-CM | POA: Diagnosis present

## 2011-10-20 HISTORY — DX: Rheumatic fever without heart involvement: I00

## 2011-10-20 HISTORY — DX: Chronic kidney disease, unspecified: N18.9

## 2011-10-20 HISTORY — DX: Unspecified cataract: H26.9

## 2011-10-20 HISTORY — DX: Reserved for inherently not codable concepts without codable children: IMO0001

## 2011-10-20 HISTORY — DX: Myoneural disorder, unspecified: G70.9

## 2011-10-20 HISTORY — DX: Sleep apnea, unspecified: G47.30

## 2011-10-20 HISTORY — DX: Anemia, unspecified: D64.9

## 2011-10-20 HISTORY — DX: Pneumonia, unspecified organism: J18.9

## 2011-10-20 HISTORY — DX: Encounter for other specified aftercare: Z51.89

## 2011-10-20 HISTORY — DX: Malignant (primary) neoplasm, unspecified: C80.1

## 2011-10-20 HISTORY — DX: Angina pectoris, unspecified: I20.9

## 2011-10-20 HISTORY — DX: Unspecified osteoarthritis, unspecified site: M19.90

## 2011-10-20 HISTORY — DX: Essential (primary) hypertension: I10

## 2011-10-20 HISTORY — DX: Major depressive disorder, single episode, unspecified: F32.9

## 2011-10-20 HISTORY — DX: Cardiac murmur, unspecified: R01.1

## 2011-10-20 HISTORY — DX: Anxiety disorder, unspecified: F41.9

## 2011-10-20 HISTORY — DX: Malignant neoplasm of prostate: C61

## 2011-10-20 LAB — DIFFERENTIAL
Basophils Absolute: 0 10*3/uL (ref 0.0–0.1)
Basophils Relative: 0 % (ref 0–1)
Eosinophils Absolute: 0.3 10*3/uL (ref 0.0–0.7)
Monocytes Absolute: 1 10*3/uL (ref 0.1–1.0)
Monocytes Relative: 11 % (ref 3–12)

## 2011-10-20 LAB — CBC
HCT: 35.1 % — ABNORMAL LOW (ref 39.0–52.0)
Hemoglobin: 11.4 g/dL — ABNORMAL LOW (ref 13.0–17.0)
MCH: 31.1 pg (ref 26.0–34.0)
MCHC: 32.5 g/dL (ref 30.0–36.0)
RDW: 14.8 % (ref 11.5–15.5)

## 2011-10-20 LAB — BASIC METABOLIC PANEL
BUN: 26 mg/dL — ABNORMAL HIGH (ref 6–23)
Calcium: 9.5 mg/dL (ref 8.4–10.5)
Creatinine, Ser: 1.52 mg/dL — ABNORMAL HIGH (ref 0.50–1.35)
GFR calc Af Amer: 49 mL/min — ABNORMAL LOW (ref >90)
GFR calc non Af Amer: 42 mL/min — ABNORMAL LOW (ref >90)

## 2011-10-20 LAB — SEDIMENTATION RATE: Sed Rate: 60 mm/hr — ABNORMAL HIGH (ref 0–16)

## 2011-10-20 MED ORDER — HYDROMORPHONE HCL PF 1 MG/ML IJ SOLN: 0.5000 mg | INTRAMUSCULAR | Status: DC | PRN

## 2011-10-20 MED ORDER — METOCLOPRAMIDE HCL 5 MG/ML IJ SOLN
5.0000 mg | Freq: Three times a day (TID) | INTRAMUSCULAR | Status: DC | PRN
  Filled 2011-10-20: qty 2

## 2011-10-20 MED ORDER — HYDROCODONE-ACETAMINOPHEN 5-325 MG PO TABS
1.0000 | ORAL_TABLET | ORAL | Status: DC | PRN
  Administered 2011-10-20 – 2011-10-21 (×3): 2 via ORAL
  Filled 2011-10-20 (×3): qty 2

## 2011-10-20 MED ORDER — ACETAMINOPHEN 650 MG RE SUPP: 650.0000 mg | Freq: Four times a day (QID) | RECTAL | Status: DC | PRN

## 2011-10-20 MED ORDER — ONDANSETRON HCL 4 MG/2ML IJ SOLN: 4.0000 mg | Freq: Four times a day (QID) | INTRAMUSCULAR | Status: DC | PRN

## 2011-10-20 MED ORDER — DOCUSATE SODIUM 100 MG PO CAPS
100.0000 mg | ORAL_CAPSULE | Freq: Two times a day (BID) | ORAL | Status: DC
  Administered 2011-10-20: 100 mg via ORAL
  Filled 2011-10-20 (×6): qty 1

## 2011-10-20 MED ORDER — PHENOL 1.4 % MT LIQD
1.0000 | OROMUCOSAL | Status: DC | PRN
  Filled 2011-10-20: qty 177

## 2011-10-20 MED ORDER — MAGNESIUM HYDROXIDE 400 MG/5ML PO SUSP: 30.0000 mL | Freq: Two times a day (BID) | ORAL | Status: DC | PRN

## 2011-10-20 MED ORDER — DIAZEPAM 5 MG PO TABS
10.0000 mg | ORAL_TABLET | Freq: Four times a day (QID) | ORAL | Status: DC | PRN
  Administered 2011-10-20: 5 mg via ORAL
  Administered 2011-10-21: 10 mg via ORAL
  Administered 2011-10-21: 5 mg via ORAL
  Filled 2011-10-20: qty 2
  Filled 2011-10-20 (×3): qty 1

## 2011-10-20 MED ORDER — ONDANSETRON HCL 4 MG PO TABS: 4.0000 mg | ORAL_TABLET | Freq: Four times a day (QID) | ORAL | Status: DC | PRN

## 2011-10-20 MED ORDER — BISACODYL 5 MG PO TBEC: 10.0000 mg | DELAYED_RELEASE_TABLET | Freq: Every day | ORAL | Status: DC | PRN

## 2011-10-20 MED ORDER — FLEET ENEMA 7-19 GM/118ML RE ENEM: 1.0000 | ENEMA | Freq: Every day | RECTAL | Status: DC | PRN

## 2011-10-20 MED ORDER — ALUM & MAG HYDROXIDE-SIMETH 200-200-20 MG/5ML PO SUSP: 30.0000 mL | ORAL | Status: DC | PRN

## 2011-10-20 MED ORDER — BISACODYL 10 MG RE SUPP: 10.0000 mg | Freq: Every day | RECTAL | Status: DC | PRN

## 2011-10-20 MED ORDER — CEFAZOLIN SODIUM 1-5 GM-% IV SOLN
1.0000 g | Freq: Three times a day (TID) | INTRAVENOUS | Status: DC
  Administered 2011-10-20 – 2011-10-21 (×3): 1 g via INTRAVENOUS
  Filled 2011-10-20 (×4): qty 50

## 2011-10-20 MED ORDER — METHOCARBAMOL 500 MG PO TABS: 500.0000 mg | ORAL_TABLET | Freq: Four times a day (QID) | ORAL | Status: DC | PRN

## 2011-10-20 MED ORDER — METOCLOPRAMIDE HCL 10 MG PO TABS: 5.0000 mg | ORAL_TABLET | Freq: Three times a day (TID) | ORAL | Status: DC | PRN

## 2011-10-20 MED ORDER — SODIUM CHLORIDE 0.9 % IV SOLN
Freq: Once | INTRAVENOUS | Status: AC
  Administered 2011-10-20: 14:00:00 via INTRAVENOUS

## 2011-10-20 MED ORDER — METHOCARBAMOL 100 MG/ML IJ SOLN
500.0000 mg | Freq: Four times a day (QID) | INTRAVENOUS | Status: DC | PRN
  Filled 2011-10-20: qty 5

## 2011-10-20 MED ORDER — ACETAMINOPHEN 325 MG PO TABS: 650.0000 mg | ORAL_TABLET | Freq: Four times a day (QID) | ORAL | Status: DC | PRN

## 2011-10-20 MED ORDER — MENTHOL 3 MG MT LOZG: 1.0000 | LOZENGE | OROMUCOSAL | Status: DC | PRN

## 2011-10-20 MED ORDER — POLYETHYLENE GLYCOL 3350 17 G PO PACK
17.0000 g | PACK | Freq: Every day | ORAL | Status: DC | PRN
  Filled 2011-10-20: qty 1

## 2011-10-20 MED ORDER — ALUMINUM HYDROXIDE GEL 320 MG/5ML PO SUSP
15.0000 mL | ORAL | Status: DC | PRN
  Filled 2011-10-20: qty 30

## 2011-10-20 NOTE — H&P (Signed)
Christopher Hensley is an 77 y.o. male.   Chief Complaint: Right knee pain HPI: 77 year old male complaint of pain in the right knee 3 weeks out from a total knee arthroplasty. Patient was discharged from the hospital and week ago and sent to skilled nursing facility to which he was discharged home on Friday.  Complained of pain in his nose.  Saw his primary care doctor Dr. Pete Glatter on Monday who in turn sent him to ENT for a nasal abscess which was drained today October 20, 2011.  Knee started to drain this morning and reports of purulent material from incision. Denies fall on knee.  Dr. Sherlean Foot drained the knee and got out 20cc of bloody fluid.  Sent for labs.    No past medical history on file.  No past surgical history on file.  No family history on file. Social History:  does not have a smoking history on file. He does not have any smokeless tobacco history on file. His alcohol and drug histories not on file.  Allergies: Allergies not on file  No current facility-administered medications on file as of .   No current outpatient prescriptions on file as of .    No results found for this or any previous visit (from the past 48 hour(s)). No results found.  Review of Systems  Constitutional: Positive for fever and chills. Negative for weight loss, malaise/fatigue and diaphoresis.  HENT: Positive for nosebleeds. Negative for hearing loss, ear pain, congestion, sore throat and tinnitus.   Eyes: Negative.   Respiratory: Negative.  Negative for stridor.   Cardiovascular: Positive for leg swelling. Negative for chest pain, palpitations, orthopnea, claudication and PND.  Gastrointestinal: Negative.   Genitourinary: Negative.   Musculoskeletal: Negative.   Skin: Negative for itching and rash.  Neurological: Negative.  Negative for weakness and headaches.  Endo/Heme/Allergies: Negative.   Psychiatric/Behavioral: Negative for depression, suicidal ideas, hallucinations, memory loss and substance  abuse. The patient is nervous/anxious. The patient does not have insomnia.     There were no vitals taken for this visit. Physical Exam  Constitutional: He is oriented to person, place, and time. He appears well-developed and well-nourished.  HENT:  Nose:    Neck: Normal range of motion. Neck supple.  Cardiovascular: Normal rate and regular rhythm.   Musculoskeletal: He exhibits edema and tenderness.       Right knee: He exhibits decreased range of motion, swelling and effusion.  Neurological: He is alert and oriented to person, place, and time.  Skin: Skin is warm. There is erythema.     Assessment/Plan Possible infected right total knee s/p 3 weeks and new nasal abscess  1.direct admit to [address] 2.ID consult  Babbie Dondlinger 10/20/2011, 11:36 AM

## 2011-10-20 NOTE — Consult Note (Addendum)
Infectious Diseases Consultation  Reason for Consult:  Early septic prosthetic joint Referring Physician: Dr. Glory Buff is an 77 y.o. male who has prior history of L-TKA and severe OA of right knee. He underwent right TKA on 10/15 and discharged to SNF for rehabilitation. He recently was released to going back home 3 days prior to this admission. He started to notice having a pimple on his nasa septum of right nares which was bothersome, where he occasionally picked at it. Over the course of 48hrs it had become significantly swollen, erythematous, and exquisitely tender. So much swelling he was unable to place his dentures. Thus did not eat well for the past 2 days. He planned to go to ENT on the morning of this admission but also noted to have spontaneous drainage from his right TKA. He denies swelling, erythema, warmth, decreased range of motion in that knee. He also denies any constitutional symptoms of fever/chills/ns/ n/v/d. He went to ENT for which they did a small incision to the abscess to drain purulent fluid which was sent for culture. The patient was started on doxycycline. He then was referred to his orthopedist since he had drainage from his recent TKA. At this doctors visit an anthrocentesis was performed for which 20mL of serosanguinous, possibly purulent fluid was aspirated. Cell count pending. Patient was directly admitted to the hospital with the intent of doing and I X D. He was started on cefazolin 2gm Q8hr since he is not known to be MRSA colonized from his prior admission.  Past Medical History: Bilateral knee OA s/p L-TKA in 2004, R-TKA 2012 DM HTN Gout Normocytic anemia mecke'l's diverticulum Hx of prostate ca s/p radiation therapy ckd Atypical chest pain, normal cors by cath in 2005  Family history: CAD  Social History:  does not have a smoking history on file. He does not have any smokeless tobacco history on file. His alcohol and drug histories not on  file. he is a widow, and lost his wife this past February. He does subscribe to be depressed, and fatigued with all this recent hospitalization. He is accompanied by his sister at this visit.  Allergies:  Allergies  Allergen Reactions  . Oxycodone Other (See Comments)    agitation      . sodium chloride   Intravenous Once  . ceFAZolin (ANCEF) IV  1 g Intravenous Q8H  . docusate sodium  100 mg Oral BID   Review of Systems  Constitutional: Positive for malaise/fatigue. Negative for fever, chills, weight loss and diaphoresis.  HENT: Negative for ear pain, nosebleeds, congestion, neck pain and ear discharge.        Tender nasal septum  Eyes: Negative for pain and discharge.  Respiratory: Negative for cough, sputum production and shortness of breath.   Cardiovascular: Negative for palpitations, orthopnea, claudication and leg swelling.  Gastrointestinal: Negative for nausea, vomiting, abdominal pain, diarrhea and constipation.  Genitourinary: Negative for dysuria and urgency.  Musculoskeletal: Positive for joint pain. Negative for myalgias and back pain.  Skin: Negative for itching and rash.  Neurological: Positive for weakness. Negative for dizziness.  Endo/Heme/Allergies: Negative.   Psychiatric/Behavioral: Negative.    Blood pressure 164/48, pulse 40, temperature 98 F (36.7 C), temperature source Oral, resp. rate 18, SpO2 98.00%. Physical Exam  Constitutional: He is oriented to person, place, and time. He appears well-developed. No distress.  HENT:  Head: Normocephalic and atraumatic.  Mouth/Throat: Oropharyngeal exudate present.       Purulent exudate still draining  from right nares, edematous and erythema noted  Eyes: Conjunctivae and EOM are normal. Pupils are equal, round, and reactive to light. Right eye exhibits no discharge. Left eye exhibits no discharge. No scleral icterus.  Neck: Normal range of motion. No JVD present. No tracheal deviation present. No thyromegaly  present.  Cardiovascular: Normal rate and regular rhythm.  Exam reveals no gallop and no friction rub.   No murmur heard. Respiratory: Effort normal. No respiratory distress. He has no wheezes. He has no rales.  GI: Soft. Bowel sounds are normal. He exhibits no distension. There is no tenderness. There is no rebound.  Musculoskeletal: Normal range of motion. He exhibits edema. He exhibits no tenderness.       Right knee is wrapped, but does have some swelling, erythema along incision site  Lymphadenopathy:    He has no cervical adenopathy.  Neurological: He is alert and oriented to person, place, and time. He displays normal reflexes. No cranial nerve deficit. He exhibits normal muscle tone. Coordination normal.  Skin: Skin is warm and dry. No rash noted. He is not diaphoretic. There is erythema. No pallor.  Psychiatric: He has a normal mood and affect.   LABS: CBC    Component Value Date/Time   WBC 8.7 10/20/2011 1608   RBC 3.66* 10/20/2011 1608   HGB 11.4* 10/20/2011 1608   HCT 35.1* 10/20/2011 1608   PLT 240 10/20/2011 1608   MCV 95.9 10/20/2011 1608   MCH 31.1 10/20/2011 1608   MCHC 32.5 10/20/2011 1608   RDW 14.8 10/20/2011 1608   LYMPHSABS 1.6 10/20/2011 1608   MONOABS 1.0 10/20/2011 1608   EOSABS 0.3 10/20/2011 1608   BASOSABS 0.0 10/20/2011 1608    BMET    Component Value Date/Time   NA 141 10/20/2011 1608   K 3.7 10/20/2011 1608   CL 103 10/20/2011 1608   CO2 28 10/20/2011 1608   GLUCOSE 83 10/20/2011 1608   BUN 26* 10/20/2011 1608   CREATININE 1.52* 10/20/2011 1608   CALCIUM 9.5 10/20/2011 1608   GFRNONAA 42* 10/20/2011 1608   GFRAA 49* 10/20/2011 1608   Micro: pending  Assessment/Plan: 77yo Male with osteoarthritis s/p TKA who presents with early prosthetic joint infection and concurrent nasal abscess, currently on cefazolin. Cultures pending.  1) await cell count,gram stain, and culture. Patient may have to be changed to a different antibiotic pending results. Patient doesn't  report acutely having symptoms of his knee causing problems, possibly suggestive of an indolent pathogen. It is likely caused a pathogen for skin flora, such as staph epi, would recommend vancomycin instead of cefazolin. Can decide to change once culture results are back. 2) will recommend 4 wk duration of antibiotics 3) would recommend I X D and poly-exchange to decrease the bacteria burden. This may not guarantee cure of his prosthetic joint infection, but the patient is presenting early from his surgery/early from his infection. 4) will follow up on his outside cultures from nares abscess and recent aspiration of synovial fluid. 5) we will check CRP, ESR . If patient to have fever > 100.3, please get 2 sets of blood cultures  Christopher Hensley 10/20/2011, 5:04 PM

## 2011-10-21 LAB — GLUCOSE, CAPILLARY
Glucose-Capillary: 96 mg/dL (ref 70–99)
Glucose-Capillary: 99 mg/dL (ref 70–99)
Glucose-Capillary: 104 mg/dL — ABNORMAL HIGH (ref 70–99)

## 2011-10-21 MED ORDER — LISINOPRIL 10 MG PO TABS
10.0000 mg | ORAL_TABLET | Freq: Every day | ORAL | Status: DC
  Administered 2011-10-21 – 2011-10-22 (×2): 10 mg via ORAL
  Filled 2011-10-21 (×2): qty 1

## 2011-10-21 MED ORDER — COLCHICINE 0.6 MG PO TABS
0.6000 mg | ORAL_TABLET | Freq: Every day | ORAL | Status: DC
  Administered 2011-10-21 – 2011-10-22 (×2): 0.6 mg via ORAL
  Filled 2011-10-21 (×2): qty 1

## 2011-10-21 MED ORDER — ALLOPURINOL 100 MG PO TABS
100.0000 mg | ORAL_TABLET | Freq: Every day | ORAL | Status: DC
  Administered 2011-10-21 – 2011-10-22 (×2): 100 mg via ORAL
  Filled 2011-10-21 (×2): qty 1

## 2011-10-21 MED ORDER — TORSEMIDE 20 MG PO TABS
20.0000 mg | ORAL_TABLET | Freq: Every day | ORAL | Status: DC
  Administered 2011-10-21 – 2011-10-22 (×2): 20 mg via ORAL
  Filled 2011-10-21 (×2): qty 1

## 2011-10-21 MED ORDER — VANCOMYCIN HCL 1000 MG IV SOLR
2000.0000 mg | Freq: Once | INTRAVENOUS | Status: DC
  Administered 2011-10-21: 2000 mg via INTRAVENOUS
  Filled 2011-10-21: qty 2000

## 2011-10-21 MED ORDER — CELECOXIB 200 MG PO CAPS
200.0000 mg | ORAL_CAPSULE | Freq: Two times a day (BID) | ORAL | Status: DC
  Administered 2011-10-22: 200 mg via ORAL
  Filled 2011-10-21 (×3): qty 1

## 2011-10-21 MED ORDER — GLIPIZIDE 5 MG PO TABS
5.0000 mg | ORAL_TABLET | Freq: Every day | ORAL | Status: DC
  Administered 2011-10-22: 5 mg via ORAL
  Filled 2011-10-21 (×2): qty 1

## 2011-10-21 MED ORDER — ASPIRIN 81 MG PO CHEW
81.0000 mg | CHEWABLE_TABLET | Freq: Every day | ORAL | Status: DC
  Administered 2011-10-22: 81 mg via ORAL
  Filled 2011-10-21: qty 1

## 2011-10-21 MED ORDER — VANCOMYCIN HCL 1000 MG IV SOLR: 1500.0000 mg | INTRAVENOUS | Status: DC

## 2011-10-21 NOTE — Progress Notes (Signed)
Physical Therapy Evaluation Patient Details Name: Christopher Hensley MRN: [id number] DOB: 09/24/1932 Today's Date: 10/21/2011  Problem List: There is no problem list on file for this patient.   Past Medical History:  Past Medical History  Diagnosis Date  . Heart murmur     "as a child"  . Rheumatic fever     "when I was a kid"  . Sleep apnea 10/20/11    "have a machine; don't use it"  . Chronic kidney disease 10/20/11    "go to dr twice a year to check my kidneys; don't know what for"  . Cancer     prostate  . Cancer of prostate     "had 38 radiation txs; last tx ~ 2004"  . Hypertension   . Blood transfusion     "last time 09/2011"  . Arthritis   . Anxiety     lost wife 01/2011  . Angina     chest pain "anxiety related"  . Pneumonia     "probably as a child"  . Diabetes mellitus     "take pills"  . Anemia   . Neuromuscular disorder     "peripheral neuropathy"  . Depression 09/19/11     "always depressed"  . Cataract immature     "both eyes"   Past Surgical History:  Past Surgical History  Procedure Date  . Prostate cryoablation     "dissolved it"  . Joint replacement 09/28/11    right knee replacement  . Joint replacement ~ 1990's    left knee replacement  . Tonsillectomy     "I think they took them out when I was a kid"  . Cardiac catheterization     denies h/o stents    PT Assessment/Plan/Recommendation PT Assessment Clinical Impression Statement: Pt is mod I in the room and ambulating in the halls. Pt is experiencing some knee pain due to an active infection. Reviewed home exercise program with patient to adrress some mild knee weakness and  some decreased ROM on Right side. Pt agreeable to do HEP and ambulate in the hall until d/c home.  PT Recommendation/Assessment: Patent does not need any further PT services No Skilled PT: All education completed;Patient is modified independent with all activity/mobility PT Goals     PT  Evaluation Precautions/Restrictions  Restrictions Weight Bearing Restrictions: No Prior Functioning  Home Living Lives With: Alone Type of Home: House Home Layout: Able to live on main level with bedroom/bathroom;Other (Comment) (basement, but does not go down) Home Access: Ramped entrance Prior Function Level of Independence: Requires assistive device for independence Driving: Yes Vocation: Retired Financial risk analyst Arousal/Alertness: Awake/alert Overall Cognitive Status: Appears within functional limits for tasks assessed Sensation/Coordination   Extremity Assessment RLE Assessment RLE Assessment: Exceptions to [facility] RLE AROM (degrees) Right Knee Extension 0-130: 0  Right Knee Flexion 0-140: 95  RLE Strength Right Knee Flexion: 4/5 Right Knee Extension: 4/5 LLE Assessment LLE Assessment: Within Functional Limits Mobility (including Balance) Bed Mobility Bed Mobility: Yes Supine to Sit: 7: Independent Transfers Transfers: Yes Sit to Stand: 6: Modified independent (Device/Increase time);With upper extremity assist;From bed Stand to Sit: 6: Modified independent (Device/Increase time);With upper extremity assist;To chair/3-in-1 Ambulation/Gait Ambulation/Gait: Yes Ambulation/Gait Assistance: 6: Modified independent (Device/Increase time) Ambulation Distance (Feet): 300 Feet Assistive device: Straight cane Gait Pattern: Antalgic Gait velocity: mild decrease in speed  Posture/Postural Control Posture/Postural Control: No significant limitations Balance Balance Assessed: Yes Static Standing Balance Static Standing - Balance Support: Left upper extremity supported Static Standing -  Level of Assistance: 6: Modified independent (Device/Increase time) Exercise  Total Joint Exercises Ankle Circles/Pumps: AROM;15 reps Quad Sets: AROM;15 reps;Seated Hip ABduction/ADduction: AROM;15 reps;Seated Straight Leg Raises: AROM;15 reps;Seated Long Arc Quad: AROM;15  reps;Seated Knee Flexion: AROM;Right;15 reps;Seated End of Session PT - End of Session Equipment Utilized During Treatment: Gait belt Activity Tolerance: Patient tolerated treatment well Patient left: in chair;with call bell in reach Nurse Communication: Mobility status for transfers;Mobility status for ambulation;Other (comment) (Pt is Mod I in the room and hallways) General Behavior During Session: [facility] for tasks performed Cognition: [facility] for tasks performed  Greggory Stallion 10/21/2011, 11:35 AM

## 2011-10-21 NOTE — Progress Notes (Signed)
PATIENT ID:      Christopher Hensley  MRN:     [id number] DOB/AGE:    [birth year] / 77 y.o.    PROGRESS NOTE Subjective:  negative for Chest Pain  negative for Shortness of Breath  negative for Nausea/Vomiting   negative for Calf Pain  negative for Bowel Movement   Tolerating Diet: yes         Patient reports pain as 4 on 0-10 scale.    Objective: Vital signs in last 24 hours: Temp:  [98.3 F (36.8 C)] 98.3 F (36.8 C) (11/07 0703) Pulse Rate:  [68-82] 68  (11/07 0703) Resp:  [12-20] 20  (11/07 0703) BP: (159-172)/(72-81) 172/81 mmHg (11/07 0703) SpO2:  [93 %-95 %] 95 % (11/07 0703)    Intake/Output from previous day: I/O last 3 completed shifts: In: 240 [P.O.:240] Out: -    Intake/Output this shift:     LABORATORY DATA: @LABLAST (WBC:3,HGB:3,HCT:3,PLT:3) Sodium  Date/Time Value Range Status  10/20/2011  4:08 PM 141  135-145 (mEq/L) Final  10/05/2011  6:25 AM 142  135-145 (mEq/L) Final  10/04/2011  3:51 AM 138  135-145 (mEq/L) Final     Potassium  Date/Time Value Range Status  10/20/2011  4:08 PM 3.7  3.5-5.1 (mEq/L) Final  10/05/2011  6:25 AM 4.2  3.5-5.1 (mEq/L) Final     DELTA CHECK NOTED  10/04/2011  3:51 AM 3.2* 3.5-5.1 (mEq/L) Final     Chloride  Date/Time Value Range Status  10/20/2011  4:08 PM 103  96-112 (mEq/L) Final  10/05/2011  6:25 AM 107  96-112 (mEq/L) Final  10/04/2011  3:51 AM 104  96-112 (mEq/L) Final     CO2  Date/Time Value Range Status  10/20/2011  4:08 PM 28  19-32 (mEq/L) Final  10/05/2011  6:25 AM 24  19-32 (mEq/L) Final  10/04/2011  3:51 AM 25  19-32 (mEq/L) Final     BUN  Date/Time Value Range Status  10/20/2011  4:08 PM 26* 6-23 (mg/dL) Final  40/98/1191  4:78 AM 31* 6-23 (mg/dL) Final  29/56/2130  8:65 AM 33* 6-23 (mg/dL) Final     Creatinine, Ser  Date/Time Value Range Status  10/20/2011  4:08 PM 1.52* 0.50-1.35 (mg/dL) Final  78/46/9629  5:28 AM 1.49* 0.50-1.35 (mg/dL) Final  41/32/4401  0:27 AM 1.73* 0.50-1.35 (mg/dL) Final       Glucose, Bld  Date/Time Value Range Status  10/20/2011  4:08 PM 83  70-99 (mg/dL) Final  25/36/6440  3:47 AM 97  70-99 (mg/dL) Final  42/59/5638  7:56 AM 99  70-99 (mg/dL) Final     Calcium  Date/Time Value Range Status  10/20/2011  4:08 PM 9.5  8.4-10.5 (mg/dL) Final  43/32/9518  8:41 AM 8.8  8.4-10.5 (mg/dL) Final  66/05/3015  0:10 AM 8.2* 8.4-10.5 (mg/dL) Final   Lab Results  Component Value Date   INR 1.22 10/03/2011   INR 1.07 09/17/2011   INR 1.06 11/24/2009    Examination:  General appearance: alert and cooperative  Wound Exam: draining   Drainage:  None: wound tissue dry  Motor Exam EHL and FHL Intact  Sensory Exam Superficial Peroneal, Deep Peroneal and Tibial normal  Assessment:        S/P nasal abscess  Suspect POSSIBLE INFECTION  ADDITIONAL DIAGNOSIS:  Acute Blood Loss Anemia  Plan: Physical Therapy as ordered Weight Bearing as Tolerated (WBAT)  DVT Prophylaxis:  None  DISCHARGE PLAN: Home  DISCHARGE NEEDS: HHPT, Walker and 3-in-1 comode seat  Ramello Cordial 10/21/2011, 1:41 PM

## 2011-10-21 NOTE — Consult Note (Signed)
ANTIBIOTIC CONSULT NOTE - FOLLOW UP  Pharmacy Consult for Vancomycin Indication: Prosthetic joint infection and concurrent nasal abscess.  Allergies  Allergen Reactions  . Oxycodone Other (See Comments)    agitation    Patient Measurements: Height: 6' (182.9 cm) Weight: 253 lb 1.4 oz (114.8 kg) IBW/kg (Calculated) : 77.6    Vital Signs: Temp: 98.3 F (36.8 C) (11/07 0703) BP: 172/81 mmHg (11/07 0703) Pulse Rate: 68  (11/07 0703) Intake/Output from previous day: 11/06 0701 - 11/07 0700 In: 240 [P.O.:240] Out: -      Labs:  Basename 10/20/11 1608  WBC 8.7  HGB 11.4*  PLT 240  LABCREA --  CREATININE 1.52*   Estimated Creatinine Clearance: 53.2 ml/min (by C-G formula based on Cr of 1.52).   Microbiology: Recent Results (from the past 720 hour(s))  CULTURE, BLOOD (ROUTINE X 2)     Status: Normal   Collection Time   10/03/11 12:47 AM      Component Value Range Status Comment   Specimen Description BLOOD RIGHT ARM   Final    Special Requests BOTTLES DRAWN AEROBIC AND ANAEROBIC 10CC   Final    Setup Time [id number]   Final    Culture NO GROWTH 5 DAYS   Final    Report Status 10/09/2011 FINAL   Final   CULTURE, BLOOD (ROUTINE X 2)     Status: Normal   Collection Time   10/03/11 12:47 AM      Component Value Range Status Comment   Specimen Description BLOOD RIGHT ARM   Final    Special Requests BOTTLES DRAWN AEROBIC AND ANAEROBIC 6CC   Final    Setup Time [id number]   Final    Culture NO GROWTH 5 DAYS   Final    Report Status 10/09/2011 FINAL   Final     Anti-infectives     Start     Dose/Rate Route Frequency Ordered Stop   10/20/11 1600   ceFAZolin (ANCEF) IVPB 1 g/50 mL premix  Status:  Discontinued        1 g 100 mL/hr over 30 Minutes Intravenous 3 times per day 10/20/11 1358 10/21/11 1341          Past Medical History:  Bilateral knee OA s/p L-TKA in 2004, R-TKA 2012  DM  HTN  Gout  Normocytic anemia  mecke'l's diverticulum  Hx of  prostate ca s/p radiation therapy  ckd  Atypical chest pain, normal cors by cath in 2005  Assessment: 77yo Male with osteoarthritis s/p TKA who presents with early prosthetic joint infection and concurrent nasal abscess.  PTA he had received 1 day of Doxycycline and 1 day of Cefazolin.  He is now started on Vancomycin empirically.  Goal of Therapy:  Vancomycin trough level 15-20 mcg/ml  Plan:  Vancomycin 2000 mg IV Load. Then, Vancomycin 1500mg  IV q 24 hours. Follow-up cultures, Vancomycin levels as indicated.  Josefita Weissmann, Elisha Headland, Pharm.D. 10/21/2011 2:23 PM

## 2011-10-21 NOTE — Progress Notes (Signed)
Occupational Therapy Evaluation Patient Details Name: Christopher Hensley MRN: [id number] DOB: June 04, 1932 Today's Date: 10/21/2011  Problem List: There is no problem list on file for this patient.   Past Medical History:  Past Medical History  Diagnosis Date  . Heart murmur     "as a child"  . Rheumatic fever     "when I was a kid"  . Sleep apnea 10/20/11    "have a machine; don't use it"  . Chronic kidney disease 10/20/11    "go to dr twice a year to check my kidneys; don't know what for"  . Cancer     prostate  . Cancer of prostate     "had 38 radiation txs; last tx ~ 2004"  . Hypertension   . Blood transfusion     "last time 09/2011"  . Arthritis   . Anxiety     lost wife 01/2011  . Angina     chest pain "anxiety related"  . Pneumonia     "probably as a child"  . Diabetes mellitus     "take pills"  . Anemia   . Neuromuscular disorder     "peripheral neuropathy"  . Depression 09/19/11     "always depressed"  . Cataract immature     "both eyes"   Past Surgical History:  Past Surgical History  Procedure Date  . Prostate cryoablation     "dissolved it"  . Joint replacement 09/28/11    right knee replacement  . Joint replacement ~ 1990's    left knee replacement  . Tonsillectomy     "I think they took them out when I was a kid"  . Cardiac catheterization     denies h/o stents    OT Assessment/Plan/Recommendation OT Assessment OT Recommendation/Assessment: Patient does not need any further OT services OT Goals    OT Evaluation Precautions/Restrictions  Restrictions Weight Bearing Restrictions: No Prior Functioning Home Living Lives With: Alone Type of Home: House Home Layout: Able to live on main level with bedroom/bathroom;Other (Comment) Home Access: Ramped entrance Bathroom Shower/Tub: Tub/shower unit Bathroom Toilet: Handicapped height Home Adaptive Equipment: Reacher;Straight cane;Bedside commode/3-in-1 Prior Function Level of Independence:  Requires assistive device for independence (Requires use of cane occasionally) Driving: Yes Vocation: Retired ADL ADL Eating/Feeding: Simulated;Modified independent Where Assessed - Eating/Feeding: Chair Grooming: Simulated;Modified independent Where Assessed - Grooming: Standing at sink Upper Body Bathing: Simulated;Modified independent Where Assessed - Upper Body Bathing: Sitting, bed Lower Body Bathing: Modified independent;Other (comment) (requires increased time to complete task) Where Assessed - Lower Body Bathing: Sit to stand from bed Upper Body Dressing: Simulated;Modified independent Where Assessed - Upper Body Dressing: Sitting, bed Lower Body Dressing: Performed;Modified independent;Other (comment) (Required increased time ) Where Assessed - Lower Body Dressing: Sit to stand from chair Toilet Transfer: Simulated;Modified independent Toilet Transfer Method: Stand pivot Acupuncturist: Regular height toilet Toileting - Clothing Manipulation: Simulated;Modified independent Where Assessed - Toileting Clothing Manipulation: Sit to stand from 3-in-1 or toilet;Other (comment) (simulated chair level) Toileting - Hygiene: Simulated;Modified independent Where Assessed - Toileting Hygiene: Sit to stand from 3-in-1 or toilet;Other (comment) (simulated chair level) Tub/Shower Transfer: Performed;Supervision/safety Tub/Shower Transfer Method: Other (comment) (Stepped into and out of tub while holding onto the wall) Tub/Shower Transfer Equipment: Other (comment) (used wall for stabilization) Equipment Used: Cane;Other (comment) (PRN) ADL Comments: Pt is supervision with tub transfers but is mod I with BADLs Vision/Perception  Vision - History Baseline Vision: Wears glasses all the time Visual History: Cataracts;Other (comment) (beginning  stages. Pt indicates he will have surgey soon.) Patient Visual Report: No change from baseline Vision - Assessment Eye Alignment: Within  Functional Limits Cognition Cognition Arousal/Alertness: Awake/alert Overall Cognitive Status: Appears within functional limits for tasks assessed Sensation/Coordination Coordination Gross Motor Movements are Fluid and Coordinated: Yes Extremity Assessment RUE Assessment RUE Assessment: Within Functional Limits LUE Assessment LUE Assessment: Within Functional Limits Mobility  Bed Mobility Bed Mobility: No Transfers Transfers: Yes Sit to Stand: 6: Modified independent (Device/Increase time);With upper extremity assist;From bed Stand to Sit: 6: Modified independent (Device/Increase time);With upper extremity assist;To chair/3-in-1 Exercises   End of Session OT - End of Session Equipment Utilized During Treatment: Gait belt;Other (comment) (straight cane PRN) Activity Tolerance: Patient tolerated treatment well Patient left: in chair;with call bell in reach;with family/visitor present General Behavior During Session: [facility] for tasks performed Cognition: [facility] for tasks performed   Otis Peak OTS 10/21/2011, 3:41 PM   10/21/2011 Lucile Shutters  MS OTR/L 3:45PM Pager [phone]

## 2011-10-22 MED ORDER — DOXYCYCLINE HYCLATE 100 MG PO TABS
100.0000 mg | ORAL_TABLET | Freq: Two times a day (BID) | ORAL | Status: DC
  Filled 2011-10-22 (×2): qty 1

## 2011-10-22 NOTE — Progress Notes (Signed)
INFECTIOUS DISEASES PROGRESS NOTE  77yo Male with osteoarthritis s/p TKA who presents with early prosthetic joint infection and concurrent nasal abscess, currently on cefazolin. Cultures pending.  Subjective: No longer having purulent drainage from nasal abscess. No fever/chills/nightsweats. The patient denies pain at right knee, no more fluid spontaneous draining  Medications: I have reviewed the patient's current medications.    Marland Kitchen allopurinol  100 mg Oral Daily  . aspirin  81 mg Oral Daily  . celecoxib  200 mg Oral BID WC  . colchicine  0.6 mg Oral Daily  . docusate sodium  100 mg Oral BID  . glipiZIDE  5 mg Oral QAC breakfast  . lisinopril  10 mg Oral Daily  . torsemide  20 mg Oral Daily  . DISCONTD: ceFAZolin (ANCEF) IV  1 g Intravenous Q8H  . DISCONTD: vancomycin  1,500 mg Intravenous Q24H  . DISCONTD: vancomycin  2,000 mg Intravenous Once   Abtx: vanco D2,  Cefazolin -d/c'd  Objective: Vital signs in last 24 hours: Temp:  [97.4 F (36.3 C)-98.3 F (36.8 C)] 97.4 F (36.3 C) (11/07 1400) Pulse Rate:  [38-68] 38  (11/07 1400) Resp:  [18-20] 18  (11/07 1400) BP: (144-172)/(47-81) 144/47 mmHg (11/07 1400) SpO2:  [95 %-96 %] 96 % (11/07 1400) Weight:  [114.8 kg (253 lb 1.4 oz)] 253 lb 1.4 oz (114.8 kg) (11/07 1359) Weight change:  Last BM Date: 10/20/11  Gen: A XO by 4 HEENT: PERRLA, EOMI, NCAT, no scleral icterus. Right nares less edematous and erythamatous but still not back to baseline. Crusted at site of incision no active drainage. Oral pharynx is clear. No thrush Neck: supple, no lad Pulm: CTAB, no w/c/r CV: nl S1, S2, no g/m/r Abd: NTND, BS+, no guarding, no HSM Ext+ right knee is wrapped. No oozing of any fluid from incision site. Still slightly erythamatous  Lab Results:  Basename 10/20/11 1608  WBC 8.7  HGB 11.4*  HCT 35.1*  PLT 240   BMET  Basename 10/20/11 1608  NA 141  K 3.7  CL 103  CO2 28  GLUCOSE 83  BUN 26*  CREATININE 1.52*  CALCIUM  9.5    Studies/Results: No results found.  Outside records:  Nasal specimen at [facility]: no gram stain, cultures NGTD < 24hrs Arthrocentesis = 2100 with 45%N, 41% M. Negative for bacteria on gram stain  Assessment/Plan:77yo Male with osteoarthritis s/p TKA who presents with early prosthetic joint infection and concurrent nasal abscess, currently on cefazolin. Cultures pending.  Arthrocentesis cell count suggests that the fluid collection may have been a seroma that is not infected. Can discontinue vancomycin. Will still need to follow up on cultures for arthrocentesis to see that he possibly may need washout +/- change in antibiotics.   Nasal abscess = will d/c vancomycin and swich to doxycycline 100mg  BID. To follow Dr. Annalee Genta for further management as an outpatient this week. Treat for a total of 10 days.   LOS: 2 days   Judyann Munson 10/22/2011, 12:17 AM

## 2011-10-22 NOTE — Progress Notes (Signed)
PATIENT ID:      Christopher Hensley  MRN:     [id number] DOB/AGE:    August 13, 1932 / 77 y.o.    PROGRESS NOTE Subjective:  negative for Chest Pain  negative for Shortness of Breath  negative for Nausea/Vomiting   negative for Calf Pain  negative for Bowel Movement   Tolerating Diet: yes         Patient reports pain as 1 on 0-10 scale.    Objective: Vital signs in last 24 hours:  Temp:  [97.4 F (36.3 C)-97.6 F (36.4 C)] 97.6 F (36.4 C) (11/08 0545) Pulse Rate:  [38-57] 57  (11/08 0545) Resp:  [16-18] 18  (11/08 0545) BP: (144-153)/(47-72) 149/68 mmHg (11/08 0946) SpO2:  [96 %-97 %] 96 % (11/08 0545) Weight:  [114.8 kg (253 lb 1.4 oz)] 253 lb 1.4 oz (114.8 kg) (11/07 1359)    Intake/Output from previous day:  11/07 0701 - 11/08 0700 In: 1500 [P.O.:600; I.V.:400] Out: 740 [Urine:740]   Intake/Output this shift:  11/08 0701 - 11/08 1900 In: 120 [P.O.:120] Out: -    LABORATORY DATA: WBC  Date/Time Value Range Status  10/20/2011  4:08 PM 8.7  4.0-10.5 (K/uL) Final  10/04/2011  2:15 PM 7.7  4.0-10.5 (K/uL) Final  10/03/2011  1:44 AM 7.2  4.0-10.5 (K/uL) Final     Hemoglobin  Date/Time Value Range Status  10/20/2011  4:08 PM 11.4* 13.0-17.0 (g/dL) Final  14/78/2956  2:13 PM 9.7* 13.0-17.0 (g/dL) Final     POST TRANSFUSION SPECIMEN  10/03/2011  1:44 AM 7.6* 13.0-17.0 (g/dL) Final     DELTA CHECK NOTED     HCT  Date/Time Value Range Status  10/20/2011  4:08 PM 35.1* 39.0-52.0 (%) Final  10/04/2011  2:15 PM 28.6* 39.0-52.0 (%) Final  10/03/2011  1:44 AM 22.0* 39.0-52.0 (%) Final     Platelets  Date/Time Value Range Status  10/20/2011  4:08 PM 240  150-400 (K/uL) Final  10/04/2011  2:15 PM 206  150-400 (K/uL) Final  10/03/2011  1:44 AM 165  150-400 (K/uL) Final   Sodium  Date/Time Value Range Status  10/20/2011  4:08 PM 141  135-145 (mEq/L) Final  10/05/2011  6:25 AM 142  135-145 (mEq/L) Final  10/04/2011  3:51 AM 138  135-145 (mEq/L) Final     Potassium  Date/Time  Value Range Status  10/20/2011  4:08 PM 3.7  3.5-5.1 (mEq/L) Final  10/05/2011  6:25 AM 4.2  3.5-5.1 (mEq/L) Final     DELTA CHECK NOTED  10/04/2011  3:51 AM 3.2* 3.5-5.1 (mEq/L) Final     Chloride  Date/Time Value Range Status  10/20/2011  4:08 PM 103  96-112 (mEq/L) Final  10/05/2011  6:25 AM 107  96-112 (mEq/L) Final  10/04/2011  3:51 AM 104  96-112 (mEq/L) Final     CO2  Date/Time Value Range Status  10/20/2011  4:08 PM 28  19-32 (mEq/L) Final  10/05/2011  6:25 AM 24  19-32 (mEq/L) Final  10/04/2011  3:51 AM 25  19-32 (mEq/L) Final     BUN  Date/Time Value Range Status  10/20/2011  4:08 PM 26* 6-23 (mg/dL) Final  08/65/7846  9:62 AM 31* 6-23 (mg/dL) Final  95/28/4132  4:40 AM 33* 6-23 (mg/dL) Final     Creatinine, Ser  Date/Time Value Range Status  10/20/2011  4:08 PM 1.52* 0.50-1.35 (mg/dL) Final  10/10/2535  6:44 AM 1.49* 0.50-1.35 (mg/dL) Final  03/47/4259  5:63 AM 1.73* 0.50-1.35 (mg/dL) Final     Glucose,  Bld  Date/Time Value Range Status  10/20/2011  4:08 PM 83  70-99 (mg/dL) Final  45/40/9811  9:14 AM 97  70-99 (mg/dL) Final  78/29/5621  3:08 AM 99  70-99 (mg/dL) Final     Calcium  Date/Time Value Range Status  10/20/2011  4:08 PM 9.5  8.4-10.5 (mg/dL) Final  65/78/4696  2:95 AM 8.8  8.4-10.5 (mg/dL) Final  28/41/3244  0:10 AM 8.2* 8.4-10.5 (mg/dL) Final   Lab Results  Component Value Date   INR 1.22 10/03/2011   INR 1.07 09/17/2011   INR 1.06 11/24/2009    Examination:  General appearance: alert and cooperative Extremities: extremities normal, atraumatic, no cyanosis or edema, Homans sign is negative, no sign of DVT and no edema, redness or tenderness in the calves or thighs  Wound Exam: clean, dry, intact   Drainage:  None: wound tissue dry  Motor Exam EHL and FHL Intact and Impaired  Sensory Exam Tibial normal  Assessment:          ADDITIONAL DIAGNOSIS:  Active Problems:  * No active hospital problems. *   Acute Blood Loss  Anemia   Plan: Physical Therapy as ordered Weight Bearing as Tolerated (WBAT)  DVT Prophylaxis:  None  DISCHARGE PLAN: Home  DISCHARGE NEEDS: HHPT, CPM, Walker and 3-in-1 comode seat         Kashif Pooler 10/22/2011, 11:31 AM

## 2011-10-22 NOTE — Discharge Summary (Signed)
PATIENT ID:      LINDSAY SOULLIERE  MRN:     [id number] DOB/AGE:    1932/09/09 / 77 y.o.     DISCHARGE SUMMARY  ADMISSION DATE:    10/20/2011 DISCHARGE DATE:    ADMISSION DIAGNOSIS: * No surgery found *  DISCHARGE DIAGNOSIS:  * No surgery found *   Past Medical History  Diagnosis Date  . Heart murmur     "as a child"  . Rheumatic fever     "when I was a kid"  . Sleep apnea 10/20/11    "have a machine; don't use it"  . Chronic kidney disease 10/20/11    "go to dr twice a year to check my kidneys; don't know what for"  . Cancer     prostate  . Cancer of prostate     "had 38 radiation txs; last tx ~ 2004"  . Hypertension   . Blood transfusion     "last time 09/2011"  . Arthritis   . Anxiety     lost wife 01/2011  . Angina     chest pain "anxiety related"  . Pneumonia     "probably as a child"  . Diabetes mellitus     "take pills"  . Anemia   . Neuromuscular disorder     "peripheral neuropathy"  . Depression 09/19/11     "always depressed"  . Cataract immature     "both eyes"    PROCEDURE:  on * No surgery found *  CONSULTS:     HISTORY:  77 y/o male complaining of drainage from right total knee incision secondary to nasal absess.  Seen my ENT and cultures taken before admission. Cultures also taken in orthopedic office of knee drainage  HOSPITAL COURSE:  ALCIDES NUTTING is a 77 y.o. admitted on 10/20/2011 and found to have a diagnosis of * No surgery found *.  After appropriate laboratory studies were obtained  they were brought to the operating room on * No surgery found * and underwent .   They were given perioperative antibiotics:  Anti-infectives     Start     Dose/Rate Route Frequency Ordered Stop   10/22/11 1500   vancomycin (VANCOCIN) 1,500 mg in sodium chloride 0.9 % 500 mL IVPB  Status:  Discontinued        1,500 mg 250 mL/hr over 120 Minutes Intravenous Every 24 hours 10/21/11 1426 10/21/11 1640   10/22/11 1300   doxycycline (VIBRA-TABS) tablet 100 mg        100 mg Oral Every 12 hours 10/22/11 1108     10/21/11 1500   vancomycin (VANCOCIN) 2,000 mg in sodium chloride 0.9 % 500 mL IVPB  Status:  Discontinued        2,000 mg 250 mL/hr over 120 Minutes Intravenous  Once 10/21/11 1425 10/21/11 1640   10/20/11 1600   ceFAZolin (ANCEF) IVPB 1 g/50 mL premix  Status:  Discontinued        1 g 100 mL/hr over 30 Minutes Intravenous 3 times per day 10/20/11 1358 10/21/11 1341        .  Physical Therapy as ordered Weight Bearing as Tolerated (WBAT).  DVT Prophylaxis:  Aspirin  Hospital day# 1 Patient was allowed out of bed and ambulated as per PT protocol. Oxygen was weaned off keeping the O2 saturations > 92%.  Consult infectious disease   POD#2 the dressing was changed.   Placed on Doxycycline 100mg  po bid after cultures.  The  remainder of the hospital course was dedicated to ambulation and strengthening.  The patient was discharged on   in stable condition.   DIAGNOSTIC STUDIES: RECENT LABORATORY STUDIES:   Basename 10/20/11 1608  WBC 8.7  HGB 11.4*  HCT 35.1*  PLT 240    Basename 10/20/11 1608  NA 141  K 3.7  CL 103  CO2 28  BUN 26*  CREATININE 1.52*  GLUCOSE 83  CALCIUM 9.5     RECENT RADIOGRAPHIC STUDIES :  No results found.  DISCHARGE INSTRUCTIONS:   DISCHARGE MEDICATIONS:   Current Discharge Medication List    CONTINUE these medications which have NOT CHANGED   Details  allopurinol (ZYLOPRIM) 100 MG tablet Take 100 mg by mouth daily.     aspirin 81 MG tablet Take 81 mg by mouth daily.      celecoxib (CELEBREX) 200 MG capsule Take 200 mg by mouth 2 (two) times daily.      colchicine 0.6 MG tablet Take 0.6 mg by mouth daily.      diazepam (VALIUM) 5 MG tablet Take 5 mg by mouth every 6 (six) hours as needed. For anxiety     glipiZIDE (GLUCOTROL) 5 MG tablet Take 5 mg by mouth daily at 6 (six) AM.      HYDROcodone-acetaminophen (NORCO) 5-325 MG per tablet Take 2 tablets by mouth every 4 (four) hours as  needed. For pain     lisinopril (PRINIVIL,ZESTRIL) 10 MG tablet Take 10 mg by mouth daily.      methocarbamol (ROBAXIN) 500 MG tablet Take 500 mg by mouth 3 (three) times daily. For spasms     torsemide (DEMADEX) 20 MG tablet Take 20 mg by mouth daily.          FOLLOW UP VISIT:   Follow-up Information    Follow up with Raymon Mutton, MD. Call on 10/27/2011.   Contact information:   [address] [place] [address] [phone]          DISCHARGE BJ:YNWG IN Good Condition  DISPOSITION: Skilled Nursing Facility  DISCHARGE CONDITION:  Good   Jakylan Ron 10/22/2011, 11:40 AM

## 2013-10-25 ENCOUNTER — Ambulatory Visit
Admission: RE | Admit: 2013-10-25 | Discharge: 2013-10-25 | Disposition: A | Discharge status: Home / Self Care | Payer: Medicare Other | Source: Ambulatory Visit | Attending: Geriatric Medicine | Admitting: Geriatric Medicine

## 2013-10-25 ENCOUNTER — Other Ambulatory Visit: Payer: Self-pay | Admitting: Geriatric Medicine

## 2013-10-25 DIAGNOSIS — E041 Nontoxic single thyroid nodule: Secondary | ICD-10-CM

## 2013-10-25 DIAGNOSIS — R053 Chronic cough: Secondary | ICD-10-CM

## 2013-10-25 DIAGNOSIS — R05 Cough: Secondary | ICD-10-CM

## 2013-10-25 DIAGNOSIS — M545 Low back pain, unspecified: Secondary | ICD-10-CM

## 2013-10-27 ENCOUNTER — Other Ambulatory Visit: Payer: Self-pay | Admitting: Geriatric Medicine

## 2013-10-27 DIAGNOSIS — E041 Nontoxic single thyroid nodule: Secondary | ICD-10-CM

## 2013-11-01 ENCOUNTER — Other Ambulatory Visit (HOSPITAL_COMMUNITY)
Admission: RE | Admit: 2013-11-01 | Discharge: 2013-11-01 | Disposition: A | Discharge status: Home / Self Care | Payer: Medicare Other | Source: Ambulatory Visit | Attending: Interventional Radiology | Admitting: Interventional Radiology

## 2013-11-01 ENCOUNTER — Ambulatory Visit
Admission: RE | Admit: 2013-11-01 | Discharge: 2013-11-01 | Disposition: A | Discharge status: Home / Self Care | Payer: Medicare Other | Source: Ambulatory Visit | Attending: Geriatric Medicine | Admitting: Geriatric Medicine

## 2013-11-01 DIAGNOSIS — E041 Nontoxic single thyroid nodule: Secondary | ICD-10-CM | POA: Insufficient documentation

## 2014-06-04 ENCOUNTER — Other Ambulatory Visit: Payer: Self-pay | Admitting: Gastroenterology

## 2014-06-07 ENCOUNTER — Encounter (HOSPITAL_COMMUNITY): Payer: Self-pay | Admitting: Pharmacy Technician

## 2014-06-11 ENCOUNTER — Encounter (HOSPITAL_COMMUNITY): Payer: Self-pay | Admitting: *Deleted

## 2014-06-14 ENCOUNTER — Encounter (HOSPITAL_COMMUNITY): Payer: Medicare Other | Admitting: Certified Registered"

## 2014-06-14 ENCOUNTER — Encounter (HOSPITAL_COMMUNITY)
Admission: RE | Disposition: A | Discharge status: Home / Self Care | Payer: Self-pay | Source: Ambulatory Visit | Attending: Gastroenterology

## 2014-06-14 ENCOUNTER — Encounter (HOSPITAL_COMMUNITY): Payer: Self-pay | Admitting: *Deleted

## 2014-06-14 ENCOUNTER — Ambulatory Visit (HOSPITAL_COMMUNITY): Payer: Medicare Other | Admitting: Certified Registered"

## 2014-06-14 ENCOUNTER — Ambulatory Visit (HOSPITAL_COMMUNITY)
Admission: RE | Admit: 2014-06-14 | Discharge: 2014-06-14 | Disposition: A | Discharge status: Home / Self Care | Payer: Medicare Other | Source: Ambulatory Visit | Attending: Gastroenterology | Admitting: Gastroenterology

## 2014-06-14 DIAGNOSIS — Z923 Personal history of irradiation: Secondary | ICD-10-CM | POA: Insufficient documentation

## 2014-06-14 DIAGNOSIS — Z7982 Long term (current) use of aspirin: Secondary | ICD-10-CM | POA: Insufficient documentation

## 2014-06-14 DIAGNOSIS — G4730 Sleep apnea, unspecified: Secondary | ICD-10-CM | POA: Insufficient documentation

## 2014-06-14 DIAGNOSIS — N189 Chronic kidney disease, unspecified: Secondary | ICD-10-CM | POA: Insufficient documentation

## 2014-06-14 DIAGNOSIS — Z96659 Presence of unspecified artificial knee joint: Secondary | ICD-10-CM | POA: Insufficient documentation

## 2014-06-14 DIAGNOSIS — M129 Arthropathy, unspecified: Secondary | ICD-10-CM | POA: Insufficient documentation

## 2014-06-14 DIAGNOSIS — K5730 Diverticulosis of large intestine without perforation or abscess without bleeding: Secondary | ICD-10-CM | POA: Insufficient documentation

## 2014-06-14 DIAGNOSIS — Z885 Allergy status to narcotic agent status: Secondary | ICD-10-CM | POA: Insufficient documentation

## 2014-06-14 DIAGNOSIS — Z8546 Personal history of malignant neoplasm of prostate: Secondary | ICD-10-CM | POA: Insufficient documentation

## 2014-06-14 DIAGNOSIS — R198 Other specified symptoms and signs involving the digestive system and abdomen: Secondary | ICD-10-CM | POA: Insufficient documentation

## 2014-06-14 DIAGNOSIS — I129 Hypertensive chronic kidney disease with stage 1 through stage 4 chronic kidney disease, or unspecified chronic kidney disease: Secondary | ICD-10-CM | POA: Insufficient documentation

## 2014-06-14 DIAGNOSIS — E119 Type 2 diabetes mellitus without complications: Secondary | ICD-10-CM | POA: Insufficient documentation

## 2014-06-14 DIAGNOSIS — Z79899 Other long term (current) drug therapy: Secondary | ICD-10-CM | POA: Insufficient documentation

## 2014-06-14 HISTORY — PX: COLONOSCOPY WITH PROPOFOL: SHX5780

## 2014-06-14 LAB — GLUCOSE, CAPILLARY: Glucose-Capillary: 100 mg/dL — ABNORMAL HIGH (ref 70–99)

## 2014-06-14 SURGERY — COLONOSCOPY WITH PROPOFOL
Anesthesia: Monitor Anesthesia Care

## 2014-06-14 MED ORDER — LIDOCAINE HCL (CARDIAC) 20 MG/ML IV SOLN
INTRAVENOUS | Status: DC | PRN
  Administered 2014-06-14: 50 mg via INTRAVENOUS

## 2014-06-14 MED ORDER — PROPOFOL 10 MG/ML IV BOLUS
INTRAVENOUS | Status: DC | PRN
  Administered 2014-06-14: 50 mg via INTRAVENOUS

## 2014-06-14 MED ORDER — PROPOFOL 10 MG/ML IV BOLUS
INTRAVENOUS | Status: AC
  Filled 2014-06-14: qty 20

## 2014-06-14 MED ORDER — PROPOFOL INFUSION 10 MG/ML OPTIME
INTRAVENOUS | Status: DC | PRN
Start: 2014-06-14 — End: 2014-06-14
  Administered 2014-06-14: 100 ug/kg/min via INTRAVENOUS

## 2014-06-14 MED ORDER — LACTATED RINGERS IV SOLN
INTRAVENOUS | Status: DC | PRN
  Administered 2014-06-14: 07:00:00 via INTRAVENOUS

## 2014-06-14 MED ORDER — LIDOCAINE HCL (CARDIAC) 20 MG/ML IV SOLN
INTRAVENOUS | Status: AC
  Filled 2014-06-14: qty 5

## 2014-06-14 SURGICAL SUPPLY — 21 items

## 2014-06-14 NOTE — H&P (Signed)
Christopher Hensley is an 80 y.o. male.   Chief Complaint: Change in bowel habits. HPI: Patient is here for further evaluation of change in bowel habits that he has had for the last 6 months. He has been having fecal urgency with occasional fecal incontinence. He has about 5-6 loose BM's per day with no blood or mucus in the stool. He has a good appetite and his weight has been stable. His last colonoscopy was done in 2008 when he was noted to have scattered diverticula and small internal hemorrhoids. He tried a 5 day course of Xifaxan that did not help his diarrhea at all.   Past Medical History  Diagnosis Date  . Heart murmur     "as a child"  . Rheumatic fever     "when I was a kid"  . Sleep apnea 10/20/11    "have a machine; don't use it"  . Chronic kidney disease 10/20/11    "go to dr twice a year to check my kidneys; don't know what for"  . Cancer     prostate  . Cancer of prostate     "had 85 radiation txs; last tx ~ 2004"  . Hypertension   . Blood transfusion     "last time 09/2011"  . Arthritis   . Anxiety     lost wife 01/2011  . Angina     chest pain "anxiety related"  . Pneumonia     "probably as a child"  . Diabetes mellitus     "take pills"  . Anemia   . Neuromuscular disorder     "peripheral neuropathy"  . Depression 09/19/11     "always depressed"  . Cataract immature     "both eyes"   Past Surgical History  Procedure Laterality Date  . Prostate cryoablation      "dissolved it"  . Joint replacement  09/28/11    right knee replacement  . Joint replacement  ~ 1990's    left knee replacement  . Tonsillectomy      "I think they took them out when I was a kid"  . Cardiac catheterization      denies h/o stents   History reviewed. No pertinent family history. Social History:  reports that he has never smoked. His smokeless tobacco use includes Chew. He reports that he does not drink alcohol or use illicit drugs.  Allergies:  Allergies  Allergen Reactions  .  Oxycodone Other (See Comments)    agitation    Medications Prior to Admission  Medication Sig Dispense Refill  . allopurinol (ZYLOPRIM) 100 MG tablet Take 100 mg by mouth 2 (two) times daily.       Marland Kitchen amLODipine (NORVASC) 5 MG tablet Take 5 mg by mouth every morning.      . Ascorbic Acid (VITAMIN C) 1000 MG tablet Take 1,000 mg by mouth daily.      Marland Kitchen aspirin 81 MG tablet Take 81 mg by mouth every morning.       . cholecalciferol (VITAMIN D) 1000 UNITS tablet Take 1,000 Units by mouth daily.      Marland Kitchen glipiZIDE (GLUCOTROL XL) 5 MG 24 hr tablet Take 5 mg by mouth daily with breakfast.      . lisinopril (PRINIVIL,ZESTRIL) 20 MG tablet Take 20 mg by mouth every morning.      . Multiple Vitamin (MULTIVITAMIN WITH MINERALS) TABS tablet Take 1 tablet by mouth daily.      Marland Kitchen torsemide (DEMADEX) 20 MG tablet Take  20 mg by mouth every morning.       . colchicine 0.6 MG tablet Take 0.6 mg by mouth daily as needed (gout).        Results for orders placed during the hospital encounter of 06/14/14 (from the past 48 hour(s))  GLUCOSE, CAPILLARY     Status: Abnormal   Collection Time    06/14/14  7:03 AM      Result Value Ref Range   Glucose-Capillary 100 (*) 70 - 99 mg/dL   No results found.  Review of Systems  Constitutional: Negative.   HENT: Negative.   Eyes: Negative.   Gastrointestinal: Positive for diarrhea. Negative for heartburn, nausea, vomiting, abdominal pain, constipation and blood in stool.  Musculoskeletal: Positive for joint pain.  Skin: Negative.   Neurological: Negative.   Endo/Heme/Allergies: Negative.   Psychiatric/Behavioral: Positive for depression. Negative for suicidal ideas, hallucinations, memory loss and substance abuse. The patient is nervous/anxious. The patient does not have insomnia.     There were no vitals taken for this visit. Physical Exam  Constitutional: He is oriented to person, place, and time. Vital signs are normal.  Morbidly obese  HENT:  Head:  Normocephalic and atraumatic.  Eyes: Conjunctivae and EOM are normal. Pupils are equal, round, and reactive to light.  Neck: Normal range of motion. Neck supple.  Cardiovascular: Normal rate and regular rhythm.   Respiratory: Effort normal and breath sounds normal.  GI: Soft. Bowel sounds are normal.  Neurological: He is alert and oriented to person, place, and time.  Skin: Skin is warm and dry.  Psychiatric: He has a normal mood and affect. His behavior is normal. Judgment and thought content normal.    Assessment/Plan Change in bowel habits/CRC screening; proceed with a colonoscopy at this time.  Christopher Hensley 06/14/2014, 7:10 AM

## 2014-06-14 NOTE — Anesthesia Postprocedure Evaluation (Signed)
  Anesthesia Post-op Note  Patient: Christopher Hensley  Procedure(s) Performed: Procedure(s) (LRB): COLONOSCOPY WITH PROPOFOL (N/A)  Patient Location: PACU  Anesthesia Type: MAC  Level of Consciousness: awake and alert   Airway and Oxygen Therapy: Patient Spontanous Breathing  Post-op Pain: mild  Post-op Assessment: Post-op Vital signs reviewed, Patient's Cardiovascular Status Stable, Respiratory Function Stable, Patent Airway and No signs of Nausea or vomiting  Last Vitals:  Filed Vitals:   06/14/14 0830  BP:   Pulse: 62  Temp:   Resp: 18    Post-op Vital Signs: stable   Complications: No apparent anesthesia complications

## 2014-06-14 NOTE — Anesthesia Preprocedure Evaluation (Signed)
Anesthesia Evaluation  Patient identified by MRN, date of birth, ID band Patient awake    Reviewed: Allergy & Precautions, H&P , NPO status , Patient's Chart, lab work & pertinent test results  Airway Mallampati: II TM Distance: >3 FB Neck ROM: Full    Dental no notable dental hx. (+) Edentulous Upper, Edentulous Lower   Pulmonary neg pulmonary ROS, sleep apnea ,  breath sounds clear to auscultation  Pulmonary exam normal       Cardiovascular hypertension, Pt. on medications Rhythm:Regular Rate:Normal     Neuro/Psych negative neurological ROS  negative psych ROS   GI/Hepatic negative GI ROS, Neg liver ROS,   Endo/Other  diabetes  Renal/GU negative Renal ROS  negative genitourinary   Musculoskeletal negative musculoskeletal ROS (+)   Abdominal   Peds negative pediatric ROS (+)  Hematology negative hematology ROS (+)   Anesthesia Other Findings   Reproductive/Obstetrics negative OB ROS                           Anesthesia Physical Anesthesia Plan  ASA: III  Anesthesia Plan: MAC   Post-op Pain Management:    Induction: Intravenous  Airway Management Planned: Simple Face Mask  Additional Equipment:   Intra-op Plan:   Post-operative Plan: Extubation in OR  Informed Consent: I have reviewed the patients History and Physical, chart, labs and discussed the procedure including the risks, benefits and alternatives for the proposed anesthesia with the patient or authorized representative who has indicated his/her understanding and acceptance.   Dental advisory given  Plan Discussed with: CRNA  Anesthesia Plan Comments:         Anesthesia Quick Evaluation

## 2014-06-14 NOTE — Discharge Instructions (Addendum)
Colonoscopy, Care After Refer to this sheet in the next few weeks. These instructions provide you with information on caring for yourself after your procedure. Your health care provider may also give you more specific instructions. Your treatment has been planned according to current medical practices, but problems sometimes occur. Call your health care provider if you have any problems or questions after your procedure. WHAT TO EXPECT AFTER THE PROCEDURE  After your procedure, it is typical to have the following:  A small amount of blood in your stool.  Moderate amounts of gas and mild abdominal cramping or bloating. HOME CARE INSTRUCTIONS  Do not drive, operate machinery, or sign important documents for 24 hours.  You may shower and resume your regular physical activities, but move at a slower pace for the first 24 hours.  Take frequent rest periods for the first 24 hours.  Walk around or put a warm pack on your abdomen to help reduce abdominal cramping and bloating.  Drink enough fluids to keep your urine clear or pale yellow.  You may resume your normal diet as instructed by your health care provider. Avoid heavy or fried foods that are hard to digest.  Avoid drinking alcohol for 24 hours or as instructed by your health care provider.  Only take over-the-counter or prescription medicines as directed by your health care provider.  If a tissue sample (biopsy) was taken during your procedure:  Do not take aspirin or blood thinners for 7 days, or as instructed by your health care provider.  Do not drink alcohol for 7 days, or as instructed by your health care provider.  Eat soft foods for the first 24 hours. SEEK MEDICAL CARE IF: You have persistent spotting of blood in your stool 2-3 days after the procedure. SEEK IMMEDIATE MEDICAL CARE IF:  You have more than a small spotting of blood in your stool.  You pass large blood clots in your stool.  Your abdomen is swollen  (distended).  You have nausea or vomiting.  You have a fever.  You have increasing abdominal pain that is not relieved with medicine. Document Released: 07/14/2004 Document Revised: 09/20/2013 Document Reviewed: 08/07/2013 [facility] Patient Information 2015 [place], [place]. This information is not intended to replace advice given to you by your health care provider. Make sure you discuss any questions you have with your health care provider. Monitored Anesthesia Care  Monitored anesthesia care is an anesthesia service for a medical procedure. Anesthesia is the loss of the ability to feel pain. It is produced by medications called anesthetics. It may affect a small area of your body (local anesthesia), a large area of your body (regional anesthesia), or your entire body (general anesthesia). The need for monitored anesthesia care depends your procedure, your condition, and the potential need for regional or general anesthesia. It is often provided during procedures where:   General anesthesia may be needed if there are complications. This is because you need special care when you are under general anesthesia.   You will be under local or regional anesthesia. This is so that you are able to have higher levels of anesthesia if needed.   You will receive calming medications (sedatives). This is especially the case if sedatives are given to put you in a semi-conscious state of relaxation (deep sedation). This is because the amount of sedative needed to produce this state can be hard to predict. Too much of a sedative can produce general anesthesia. Monitored anesthesia care is performed by one  or more caregivers who have special training in all types of anesthesia. You will need to meet with these caregivers before your procedure. During this meeting, they will ask you about your medical history. They will also give you instructions to follow. (For example, you will need to stop eating and drinking  before your procedure. You may also need to stop or change medications you are taking.) During your procedure, your caregivers will stay with you. They will:   Watch your condition. This includes watching you blood pressure, breathing, and level of pain.   Diagnose and treat problems that occur.   Give medications if they are needed. These may include calming medications (sedatives) and anesthetics.   Make sure you are comfortable.  Having monitored anesthesia care does not necessarily mean that you will be under anesthesia. It does mean that your caregivers will be able to manage anesthesia if you need it or if it occurs. It also means that you will be able to have a different type of anesthesia than you are having if you need it. When your procedure is complete, your caregivers will continue to watch your condition. They will make sure any medications wear off before you are allowed to go home.  Document Released: 08/26/2005 Document Revised: 03/27/2013 Document Reviewed: 01/11/2013 [facility] Patient Information 2015 [place], [place]. This information is not intended to replace advice given to you by your health care provider. Make sure you discuss any questions you have with your health care provider.

## 2014-06-14 NOTE — Op Note (Signed)
[facility] [place], [address]   OPERATIVE PROCEDURE REPORT  PATIENT: Christopher Hensley, Christopher Hensley  MR#: [id number] BIRTHDATE: [birth year] GENDER: Male ENDOSCOPIST: Edmonia James, MD ASSISTANT:   Sharon Mt, Endo Technician Luanne Bras, RN CGRN PROCEDURE DATE: 06/14/2014 PRE-PROCEDURE PREPARATION: The patient was prepped with a gallon of Golytely the night prior to the procedure. The patient was fasted for 4 hours prior to the procedure.  PRE-PROCEDURE PHYSICAL: Patient has stable vital signs.  Neck is supple.  There is no JVD, thyromegaly or LAD.  Chest clear to auscultation.  S1 and S2 regular.  Abdomen soft, morbidly obese, non-distended, non-tender with NABS. PROCEDURE:     Colonoscopy with multiple cold biopsies. ASA CLASS:     Class III INDICATIONS:     1.  Change in bowel habits. 2. Colorectal cancer screening. MEDICATIONS:     Propofol  180mg  IV  DESCRIPTION OF PROCEDURE: After the risks, benefits, and alternatives of the procedure were thoroughly explained [including a 10% missed rate of cancer and polyps], informed consent was obtained.  Digital rectal exam was performed.  The Pentax video colonoscope [id number]  was introduced through the anus  and advanced to the terminal ileum which was intubated for a short distance , limited by No adverse events experienced.   The quality of the prep was adequate. Multiple washes were done. Small lesions could be missed. The instrument was then slowly withdrawn as the colon was fully examined.     COLON FINDINGS: There was scattered diverticulosis noted throughout the entire examined colon. Patchy loss of vascular markings-multiple cold biopsies were done to rule out microscopic colitis. The rest of the colonic mucosa appeared healthy with a normal vascular pattern. No masses, polyps or AVMs were noted. he appendiceal orifice and the ICV were identified and photographed. The terminal ileum appeared  normal.  Retroflexed views revealed small internal hemorrhoids. The patient tolerated the procedure without immediate complications.  The scope was then withdrawn from the patient and the procedure terminated.  TIME TO CECUM:  2 minutes 00 seconds WITHDRAW TIME: 8 minutes 00 seconds  IMPRESSION:     1) Scattered diverticulosis noted throughout the colon. 2) Patchy loss of vascular markings-biopsied to rule out microscopic colitis. 3) Normal terminal ileum. 4) Small internal hemorrhoids.   RECOMMENDATIONS:     1.  Hold aspirin, aspirin products, and anti-inflammatory medication for 2 weeks. 2.  Await pathology results 3.  Continue current medications 4.  Continue surveillance 5.  High fiber diet with liberal fluid intake.   REPEAT EXAM:      No recall due to age.  If the patient has any abnormal GI symptoms in the interim, he have been advised to contact the office as soon as possible for further recommendations.    CPT CODES:     [id number], Colonoscopy with biopsies  DIAGNOSIS CODES:     787.99, 562.10, V76.51  REFERRED BY:Hal Stoneking, M.D.  eSigned:  Dr. Edmonia James, MD 06/14/2014 8:08 AM   PATIENT NAME:  [name], [name] MR#: [id number]

## 2014-06-14 NOTE — Transfer of Care (Signed)
Immediate Anesthesia Transfer of Care Note  Patient: Christopher Hensley  Procedure(s) Performed: Procedure(s): COLONOSCOPY WITH PROPOFOL (N/A)  Patient Location: PACU  Anesthesia Type:MAC  Level of Consciousness: awake, alert  and oriented  Airway & Oxygen Therapy: Patient Spontanous Breathing and Patient connected to face mask oxygen  Post-op Assessment: Report given to PACU RN and Post -op Vital signs reviewed and stable  Post vital signs: Reviewed and stable  Complications: No apparent anesthesia complications

## 2014-06-15 ENCOUNTER — Encounter (HOSPITAL_COMMUNITY): Payer: Self-pay | Admitting: Gastroenterology

## 2014-10-29 ENCOUNTER — Other Ambulatory Visit: Payer: Self-pay | Admitting: Geriatric Medicine

## 2014-10-29 DIAGNOSIS — E041 Nontoxic single thyroid nodule: Secondary | ICD-10-CM

## 2014-11-02 ENCOUNTER — Ambulatory Visit
Admission: RE | Admit: 2014-11-02 | Discharge: 2014-11-02 | Disposition: A | Discharge status: Home / Self Care | Payer: Medicare Other | Source: Ambulatory Visit | Attending: Geriatric Medicine | Admitting: Geriatric Medicine

## 2014-11-02 ENCOUNTER — Encounter (INDEPENDENT_AMBULATORY_CARE_PROVIDER_SITE_OTHER): Payer: Self-pay

## 2014-11-02 DIAGNOSIS — E041 Nontoxic single thyroid nodule: Secondary | ICD-10-CM

## 2015-08-21 ENCOUNTER — Other Ambulatory Visit: Payer: Self-pay | Admitting: Geriatric Medicine

## 2015-08-21 ENCOUNTER — Ambulatory Visit
Admission: RE | Admit: 2015-08-21 | Discharge: 2015-08-21 | Disposition: A | Discharge status: Home / Self Care | Payer: Medicare Other | Source: Ambulatory Visit | Attending: Geriatric Medicine | Admitting: Geriatric Medicine

## 2015-08-21 DIAGNOSIS — J209 Acute bronchitis, unspecified: Secondary | ICD-10-CM

## 2015-12-15 ENCOUNTER — Emergency Department (HOSPITAL_COMMUNITY): Payer: Medicare Other

## 2015-12-15 ENCOUNTER — Emergency Department (HOSPITAL_COMMUNITY)
Admission: EM | Admit: 2015-12-15 | Discharge: 2015-12-15 | Disposition: A | Discharge status: Home / Self Care | Payer: Medicare Other | Attending: Emergency Medicine | Admitting: Emergency Medicine

## 2015-12-15 ENCOUNTER — Encounter (HOSPITAL_COMMUNITY): Payer: Self-pay | Admitting: *Deleted

## 2015-12-15 DIAGNOSIS — M1990 Unspecified osteoarthritis, unspecified site: Secondary | ICD-10-CM | POA: Diagnosis not present

## 2015-12-15 DIAGNOSIS — Z79899 Other long term (current) drug therapy: Secondary | ICD-10-CM | POA: Insufficient documentation

## 2015-12-15 DIAGNOSIS — N189 Chronic kidney disease, unspecified: Secondary | ICD-10-CM | POA: Diagnosis not present

## 2015-12-15 DIAGNOSIS — R0789 Other chest pain: Secondary | ICD-10-CM | POA: Diagnosis not present

## 2015-12-15 DIAGNOSIS — R079 Chest pain, unspecified: Secondary | ICD-10-CM | POA: Diagnosis present

## 2015-12-15 DIAGNOSIS — F329 Major depressive disorder, single episode, unspecified: Secondary | ICD-10-CM | POA: Insufficient documentation

## 2015-12-15 DIAGNOSIS — Z7982 Long term (current) use of aspirin: Secondary | ICD-10-CM | POA: Diagnosis not present

## 2015-12-15 DIAGNOSIS — F419 Anxiety disorder, unspecified: Secondary | ICD-10-CM | POA: Insufficient documentation

## 2015-12-15 DIAGNOSIS — Z8546 Personal history of malignant neoplasm of prostate: Secondary | ICD-10-CM | POA: Diagnosis not present

## 2015-12-15 DIAGNOSIS — E119 Type 2 diabetes mellitus without complications: Secondary | ICD-10-CM | POA: Insufficient documentation

## 2015-12-15 DIAGNOSIS — I209 Angina pectoris, unspecified: Secondary | ICD-10-CM | POA: Insufficient documentation

## 2015-12-15 DIAGNOSIS — I129 Hypertensive chronic kidney disease with stage 1 through stage 4 chronic kidney disease, or unspecified chronic kidney disease: Secondary | ICD-10-CM | POA: Insufficient documentation

## 2015-12-15 DIAGNOSIS — F411 Generalized anxiety disorder: Secondary | ICD-10-CM

## 2015-12-15 DIAGNOSIS — R011 Cardiac murmur, unspecified: Secondary | ICD-10-CM | POA: Insufficient documentation

## 2015-12-15 DIAGNOSIS — Z8701 Personal history of pneumonia (recurrent): Secondary | ICD-10-CM | POA: Insufficient documentation

## 2015-12-15 LAB — BASIC METABOLIC PANEL
Anion gap: 8 (ref 5–15)
BUN: 15 mg/dL (ref 6–20)
CHLORIDE: 104 mmol/L (ref 101–111)
CO2: 29 mmol/L (ref 22–32)
Calcium: 8.8 mg/dL — ABNORMAL LOW (ref 8.9–10.3)
Creatinine, Ser: 1.29 mg/dL — ABNORMAL HIGH (ref 0.61–1.24)
GFR calc Af Amer: 58 mL/min — ABNORMAL LOW (ref >60)
GFR calc non Af Amer: 50 mL/min — ABNORMAL LOW (ref >60)
Glucose, Bld: 127 mg/dL — ABNORMAL HIGH (ref 65–99)
POTASSIUM: 4 mmol/L (ref 3.5–5.1)
Sodium: 141 mmol/L (ref 135–145)

## 2015-12-15 LAB — CBC
HEMATOCRIT: 42.2 % (ref 39.0–52.0)
Hemoglobin: 14 g/dL (ref 13.0–17.0)
MCH: 31.4 pg (ref 26.0–34.0)
MCHC: 33.2 g/dL (ref 30.0–36.0)
MCV: 94.6 fL (ref 78.0–100.0)
PLATELETS: 140 10*3/uL — AB (ref 150–400)
RBC: 4.46 MIL/uL (ref 4.22–5.81)
RDW: 14.1 % (ref 11.5–15.5)
WBC: 6.5 10*3/uL (ref 4.0–10.5)

## 2015-12-15 LAB — I-STAT TROPONIN, ED: Troponin i, poc: 0 ng/mL (ref 0.00–0.08)

## 2015-12-15 NOTE — ED Provider Notes (Signed)
CSN: [id number]     Arrival date & time 12/15/15  [id number] History   First MD Initiated Contact with Patient 12/15/15 1059     Chief Complaint  Patient presents with  . Shortness of Breath  . Chest Pain    HPI   Christopher Hensley is an 82 y.o. male with h/o HTN, DM, CKD, anxiety, and depression who presents to the ED for evaluation of chest pain and SOB. He states that he chronically has intermittent chest pain but this most recent episode started "a few days ago." However, throughout our discussion pt would say his chest pain started this morning or started weeks ago so it is unclear exactly the timeline of his symptoms. He describes the pain as a dull, constant ache in the left side of his chest. He states that this morning the pain felt like it radiated down his left arm. He reports he went to the local fire department where his BP was reportedly elevated to >200/100 and told to come to the ER. In the ED now his BP is still slightly elevated though improved. Pt states he can barely feel his chest pain. It is no longer radiating down his arm. Denies diaphoresis. He states he feels like he can't breathe very well but is not short of breath. He is not working hard to breathe and can speak comfortably in complete sentences. Pt states that he thinks his chest pain is anxiety-related. He states he always gets stressed and anxious at this time of year and states it is very depressing. Denies SI/HI. He reports he was recently started on Buspar by his PCP. Denies abdominal pain, n/v. He states he has chronic diarrhea ever since he had chemo for prostate cancer. Denies BRBPR or black tarry stools. Denies feeling weak or dizzy.   Past Medical History  Diagnosis Date  . Heart murmur     "as a child"  . Rheumatic fever     "when I was a kid"  . Sleep apnea 10/20/11    "have a machine; don't use it"  . Chronic kidney disease 10/20/11    "go to dr twice a year to check my kidneys; don't know what for"  . Cancer [facility])      prostate  . Cancer of prostate [facility])     "had 71 radiation txs; last tx ~ 2004"  . Hypertension   . Blood transfusion     "last time 09/2011"  . Arthritis   . Anxiety     lost wife 01/2011  . Angina     chest pain "anxiety related"  . Pneumonia     "probably as a child"  . Diabetes mellitus     "take pills"  . Anemia   . Neuromuscular disorder ([place])     "peripheral neuropathy"  . Depression 09/19/11     "always depressed"  . Cataract immature     "both eyes"   Past Surgical History  Procedure Laterality Date  . Prostate cryoablation      "dissolved it"  . Joint replacement  09/28/11    right knee replacement  . Joint replacement  ~ 1990's    left knee replacement  . Tonsillectomy      "I think they took them out when I was a kid"  . Cardiac catheterization      denies h/o stents  . Colonoscopy with propofol N/A 06/14/2014    Procedure: COLONOSCOPY WITH PROPOFOL;  Surgeon: Juanita Craver, MD;  [facility];  Service: Endoscopy;  Laterality: N/A;   History reviewed. No pertinent family history. Social History  Substance Use Topics  . Smoking status: Never Smoker   . Smokeless tobacco: Current User    Types: Chew     Comment: 10/20/11 "for 70 years; not regular; carton lasts me > 1 month"  . Alcohol Use: No    Review of Systems  All other systems reviewed and are negative.     Allergies  Oxycodone  Home Medications   Prior to Admission medications   Medication Sig Start Date End Date Taking? Authorizing Provider  allopurinol (ZYLOPRIM) 100 MG tablet Take 100 mg by mouth 2 (two) times daily.    Yes Historical Provider, MD  amLODipine (NORVASC) 5 MG tablet Take 5 mg by mouth every morning.   Yes Historical Provider, MD  Ascorbic Acid (VITAMIN C) 1000 MG tablet Take 1,000 mg by mouth daily.   Yes Historical Provider, MD  aspirin 81 MG tablet Take 81 mg by mouth daily.   Yes Historical Provider, MD  busPIRone (BUSPAR) 5 MG tablet Take 1 tablet by mouth  daily. 12/13/15  Yes Historical Provider, MD  furosemide (LASIX) 20 MG tablet Take 20 mg by mouth.   Yes Historical Provider, MD  glipiZIDE (GLUCOTROL XL) 5 MG 24 hr tablet Take 5 mg by mouth daily with breakfast.   Yes Historical Provider, MD  lisinopril (PRINIVIL,ZESTRIL) 20 MG tablet Take 20 mg by mouth every morning.   Yes Historical Provider, MD  Multiple Vitamin (MULTIVITAMIN WITH MINERALS) TABS tablet Take 1 tablet by mouth daily.   Yes Historical Provider, MD   BP 159/73 mmHg  Pulse 59  Temp(Src) 99.1 F (37.3 C) (Oral)  Resp 16  SpO2 93% Physical Exam  Constitutional: He is oriented to person, place, and time. No distress.  HENT:  Right Ear: External ear normal.  Left Ear: External ear normal.  Nose: Nose normal.  Mouth/Throat: Oropharynx is clear and moist. No oropharyngeal exudate.  Eyes: Conjunctivae and EOM are normal. Pupils are equal, round, and reactive to light.  Neck: Normal range of motion. Neck supple.  Cardiovascular: Normal rate, regular rhythm, normal heart sounds and intact distal pulses.   Pulmonary/Chest: Effort normal and breath sounds normal. No respiratory distress. He has no wheezes. He has no rales. He exhibits no tenderness.  Abdominal: Soft. Bowel sounds are normal. He exhibits no distension. There is no tenderness.  Musculoskeletal: He exhibits no edema.  Neurological: He is alert and oriented to person, place, and time. No cranial nerve deficit.  Skin: Skin is warm and dry. He is not diaphoretic.  Psychiatric: He has a normal mood and affect.  Nursing note and vitals reviewed.  Filed Vitals:   12/15/15 1230 12/15/15 1300 12/15/15 1315 12/15/15 1330  BP: 159/73 147/74 144/69 160/81  Pulse: 59 63 62 61  Temp:      TempSrc:      Resp: 16 17 13 14   SpO2: 93% 95% 95% 95%     ED Course  Procedures (including critical care time) Labs Review Labs Reviewed  BASIC METABOLIC PANEL - Abnormal; Notable for the following:    Glucose, Bld 127 (*)     Creatinine, Ser 1.29 (*)    Calcium 8.8 (*)    GFR calc non Af Amer 50 (*)    GFR calc Af Amer 58 (*)    All other components within normal limits  CBC - Abnormal; Notable for the following:    Platelets  140 (*)    All other components within normal limits  I-STAT TROPOININ, ED    Imaging Review Dg Chest 2 View  12/15/2015  CLINICAL DATA:  Cough, chest pain, short of breath. EXAM: CHEST  2 VIEW COMPARISON:  08/21/2015 FINDINGS: Mild cardiomegaly. Lungs are under aerated and clear. The continue to be pleural lobulations over the right hemidiaphragm and left lateral lung base. These are chronic findings. No pneumothorax. No pleural effusion. IMPRESSION: No active cardiopulmonary disease. Electronically Signed   By: Marybelle Killings M.D.   On: 12/15/2015 11:35   I have personally reviewed and evaluated these images and lab results as part of my medical decision-making.   EKG Interpretation   Date/Time:  Sunday December 15 2015 09:59:23 EST Ventricular Rate:  70 PR Interval:  176 QRS Duration: 94 QT Interval:  408 QTC Calculation: 440 R Axis:   36 Text Interpretation:  Sinus rhythm with Premature atrial complexes  Nonspecific T wave abnormality Confirmed by Ashok Cordia  MD, Lennette Bihari ([id number]) on  12/15/2015 11:02:45 AM      MDM   Final diagnoses:  Atypical chest pain  Anxiety state   Pt's labs are at his baseline and unremarkable. He has some chronic findings on CXR but no acute abnormalities. It is a bit difficult to tease out exactly when this episode of chest pain began as pt's story has changed several times during our discussions but he reports chronic intermittent chest pain. He does endorse a strong history of anxiety and stress. His BP here has been stable and unremarkable. He exhibits no objective increased work of breathing, and appears to be breathing and speaking comfortably. He has not been hypoxic at all. Discussed with attending and with pt. At this time chest pain appears to be  anxiety/stress-related and I have very low suspicion for ACS or other emergent cardiopulmonary etiology of pt's chest pain. Will d/c home with PCP follow up and strict ER return precautions. Resource guide given for mental health resources.    Anne Ng, PA-C 12/17/15 [place], MD 12/17/15 [phone]

## 2015-12-15 NOTE — Discharge Instructions (Signed)
You were seen in the emergency room today for evaluation of chest pain. Your workup was unremarkable. Your chest pain today is most likely due to anxiety/stress and it does not appear to be heart or lung-related. Please see the list below for resources for mental health to contact someone to speak to about ongoing management of your anxiety and depression. Please follow-up with your primary care provider within one week. Return to the ER for new or worsening symptoms.    Behavioral Health Resources in the Community: Intensive Outpatient Programs Organization         Address  Phone  Notes  [place]. [address], [place], [place] [phone]   [facility] Outpatient [address] [place], [place]   ADS: Alcohol & Drug Svcs [address] [place], [place]   [place] [address],  [place], [place] or [phone]   Substance Abuse Resources Organization         Address  Phone  Notes  [facility] Services  [phone]   [place]  [phone]   [place]   Chinita Pester  [phone]   Residential & Outpatient Substance Abuse Program  [phone]   Psychological Services Organization         Address  Phone  Notes  [facility]  [place]  [phone]   [place] [address], [place] or [phone]    Self-Help/Support Groups Organization         Address  Phone             Notes  [facility] [place] - variety of support groups  [place] Call for more information  Narcotics Anonymous (NA), [facility] [address], [employer] [place]  2 meetings at this location   Special educational needs teacher         Address  Phone  Notes  [facility] Residential [facility] [place],    [place]  [phone]   [facility]  [address], [place] [id number], [place], [place]   [place], [place] [phone] Admissions: 8am-3pm M-F  Incentives Substance [place] [address],    [place], [place] [phone]   [facility] [address] [place], [place], [place]   [facility] [address],  [place], [place]   [facility] - Intensive Outpatient [address], [place], [place]   [facility] ([place]  [place], [place] [phone] or [phone]   [facility] ([facility]) [address] [place], [place] Accepts Medicaid  [facility] [address],  [place] [phone] Substance Abuse/Addiction Treatment   [facility] Organization         Address  Phone  Notes  [facility]  [phone]   Domenic Schwab, PhD [address] Arlis [place]a [place], [place]   [phone] or [phone]   [place], [place] [phone]   [place] [address], [place], [place] [phone] Insurance/Medicaid/sponsorship through [employer] and Families [address], [id number]  [place], [place] [phone] [place], [place] [phone]    Dr. Adele Schilder  [phone]   [facility] Dept. 1) [address] [place] 2) [place] 3)  [address], [place] [phone]  [phone]   [place] [phone] or [phone] (After Hours)

## 2015-12-15 NOTE — ED Notes (Signed)
Pt reports ongoing sob and cough, has become more persistent over past few days. Denies swelling to extremities. No resp distress noted and ekg done.

## 2017-06-14 ENCOUNTER — Other Ambulatory Visit (HOSPITAL_COMMUNITY): Payer: Self-pay | Admitting: Urology

## 2017-06-14 DIAGNOSIS — D4102 Neoplasm of uncertain behavior of left kidney: Secondary | ICD-10-CM

## 2017-06-28 ENCOUNTER — Ambulatory Visit (HOSPITAL_COMMUNITY)
Admission: RE | Admit: 2017-06-28 | Discharge: 2017-06-28 | Disposition: A | Discharge status: Home / Self Care | Payer: Medicare Other | Source: Ambulatory Visit | Attending: Urology | Admitting: Urology

## 2017-06-28 DIAGNOSIS — D4102 Neoplasm of uncertain behavior of left kidney: Secondary | ICD-10-CM | POA: Diagnosis not present

## 2017-06-28 DIAGNOSIS — N281 Cyst of kidney, acquired: Secondary | ICD-10-CM | POA: Insufficient documentation

## 2017-06-28 DIAGNOSIS — K8020 Calculus of gallbladder without cholecystitis without obstruction: Secondary | ICD-10-CM | POA: Insufficient documentation

## 2017-06-28 DIAGNOSIS — I700 Atherosclerosis of aorta: Secondary | ICD-10-CM | POA: Diagnosis not present

## 2017-06-28 MED ORDER — GADOBENATE DIMEGLUMINE 529 MG/ML IV SOLN
20.0000 mL | Freq: Once | INTRAVENOUS | Status: AC | PRN
  Administered 2017-06-28: 20 mL via INTRAVENOUS

## 2017-07-06 ENCOUNTER — Other Ambulatory Visit (HOSPITAL_COMMUNITY): Payer: Self-pay | Admitting: Orthopedic Surgery

## 2017-07-06 DIAGNOSIS — Z96653 Presence of artificial knee joint, bilateral: Secondary | ICD-10-CM

## 2017-07-19 ENCOUNTER — Encounter (HOSPITAL_COMMUNITY)
Admission: RE | Admit: 2017-07-19 | Discharge: 2017-07-19 | Disposition: A | Discharge status: Home / Self Care | Payer: Medicare Other | Source: Ambulatory Visit | Attending: Orthopedic Surgery | Admitting: Orthopedic Surgery

## 2017-07-19 DIAGNOSIS — Z96653 Presence of artificial knee joint, bilateral: Secondary | ICD-10-CM | POA: Diagnosis not present

## 2017-07-19 MED ORDER — TECHNETIUM TC 99M MEDRONATE IV KIT
22.4000 | PACK | Freq: Once | INTRAVENOUS | Status: AC | PRN
  Administered 2017-07-19: 22.4 via INTRAVENOUS

## 2017-11-24 ENCOUNTER — Other Ambulatory Visit: Payer: Self-pay | Admitting: Physical Medicine and Rehabilitation

## 2017-11-24 DIAGNOSIS — N2889 Other specified disorders of kidney and ureter: Secondary | ICD-10-CM

## 2017-11-30 ENCOUNTER — Other Ambulatory Visit: Payer: Medicare Other

## 2017-12-29 ENCOUNTER — Other Ambulatory Visit: Payer: Self-pay

## 2017-12-29 ENCOUNTER — Ambulatory Visit: Payer: Medicare Other | Attending: Geriatric Medicine | Admitting: Physical Therapy

## 2017-12-29 ENCOUNTER — Encounter: Payer: Self-pay | Admitting: Physical Therapy

## 2017-12-29 DIAGNOSIS — G8929 Other chronic pain: Secondary | ICD-10-CM | POA: Insufficient documentation

## 2017-12-29 DIAGNOSIS — M545 Low back pain: Secondary | ICD-10-CM | POA: Insufficient documentation

## 2017-12-29 NOTE — Therapy (Signed)
[place], [place], [address] Phone: [phone]   Fax:  [phone]  Physical Therapy Evaluation/ Discharge   Patient Details  Name: Christopher Hensley MRN: [id number] Date of Birth: [birth year] No Data Recorded  Encounter Date: 12/29/2017  PT End of Session - 12/29/17 1546    Visit Number  1    Number of Visits  1       Past Medical History:  Diagnosis Date  . Anemia   . Angina    chest pain "anxiety related"  . Anxiety    lost wife 01/2011  . Arthritis   . Blood transfusion    "last time 09/2011"  . Cancer [facility])    prostate  . Cancer of prostate [facility])    "had 28 radiation txs; last tx ~ 2004"  . Cataract immature    "both eyes"  . Chronic kidney disease 10/20/11   "go to dr twice a year to check my kidneys; don't know what for"  . Depression 09/19/11    "always depressed"  . Diabetes mellitus    "take pills"  . Heart murmur    "as a child"  . Hypertension   . Neuromuscular disorder ([place])    "peripheral neuropathy"  . Pneumonia    "probably as a child"  . Rheumatic fever    "when I was a kid"  . Sleep apnea 10/20/11   "have a machine; don't use it"    Past Surgical History:  Procedure Laterality Date  . CARDIAC CATHETERIZATION     denies h/o stents  . COLONOSCOPY WITH PROPOFOL N/A 06/14/2014   Procedure: COLONOSCOPY WITH PROPOFOL;  Surgeon: Juanita Craver, MD;  Location: [facility];  Service: Endoscopy;  Laterality: N/A;  . JOINT REPLACEMENT  09/28/11   right knee replacement  . JOINT REPLACEMENT  ~ 1990's   left knee replacement  . PROSTATE CRYOABLATION     "dissolved it"  . TONSILLECTOMY     "I think they took them out when I was a kid"    There were no vitals filed for this visit.               Objective measurements completed on examination: See above findings.                           Plan - 12/29/17 1546    Clinical Impression  Statement  Patient declined therapy. He was under the impression he was coming for a scooter evaluation. Scooter evaluations are done at our neuro site and the sciprt was for his back and knees. Therapy educated the patient on the benefits and improtance of therapy but the patient declined. He feels like his co-pay is too high and he does not think exercises will work. Therapy advised him that it did not have to be sevewral visits that we could put him together a program in 1-2 visits that he could continue to do over time to improve his core strength and overall flexability and mobility. The patient declined. He would like to just get a scooter. Therapy contacted [employer] and a representavie said a script could not be sent for a scooter. The patient will go back to the MD on Saturday for an injection. Therapy contacted the patient and he will talk to the MD on Saturday about the scooter eval.        Patient will benefit from skilled therapeutic intervention in  order to improve the following deficits and impairments:     Visit Diagnosis: Chronic bilateral low back pain without sciatica     Problem List There are no active problems to display for this patient.   Carney Living 12/29/2017, 3:58 PM  [facility] [address] [place], [place], [address] Phone: [phone]   Fax:  [phone]  Name: Christopher Hensley MRN: [id number] Date of Birth: 28-Aug-1932

## 2018-02-28 ENCOUNTER — Other Ambulatory Visit: Payer: Self-pay | Admitting: Geriatric Medicine

## 2018-02-28 DIAGNOSIS — E010 Iodine-deficiency related diffuse (endemic) goiter: Secondary | ICD-10-CM

## 2018-03-01 ENCOUNTER — Ambulatory Visit
Admission: RE | Admit: 2018-03-01 | Discharge: 2018-03-01 | Disposition: A | Discharge status: Home / Self Care | Payer: Medicare Other | Source: Ambulatory Visit | Attending: Geriatric Medicine | Admitting: Geriatric Medicine

## 2018-03-01 DIAGNOSIS — E010 Iodine-deficiency related diffuse (endemic) goiter: Secondary | ICD-10-CM

## 2018-08-30 ENCOUNTER — Other Ambulatory Visit: Payer: Self-pay | Admitting: Geriatric Medicine

## 2018-08-30 DIAGNOSIS — E041 Nontoxic single thyroid nodule: Secondary | ICD-10-CM

## 2018-09-02 ENCOUNTER — Ambulatory Visit
Admission: RE | Admit: 2018-09-02 | Discharge: 2018-09-02 | Disposition: A | Discharge status: Home / Self Care | Payer: Medicare Other | Source: Ambulatory Visit | Attending: Geriatric Medicine | Admitting: Geriatric Medicine

## 2018-09-02 DIAGNOSIS — E041 Nontoxic single thyroid nodule: Secondary | ICD-10-CM

## 2020-03-19 ENCOUNTER — Other Ambulatory Visit: Payer: Self-pay | Admitting: Geriatric Medicine

## 2020-03-19 DIAGNOSIS — E041 Nontoxic single thyroid nodule: Secondary | ICD-10-CM

## 2020-03-25 ENCOUNTER — Ambulatory Visit
Admission: RE | Admit: 2020-03-25 | Discharge: 2020-03-25 | Disposition: A | Discharge status: Home / Self Care | Payer: Medicare Other | Source: Ambulatory Visit | Attending: Geriatric Medicine | Admitting: Geriatric Medicine

## 2020-03-25 DIAGNOSIS — E041 Nontoxic single thyroid nodule: Secondary | ICD-10-CM

## 2020-08-22 ENCOUNTER — Encounter: Payer: Self-pay | Admitting: Dermatology

## 2020-08-22 ENCOUNTER — Ambulatory Visit: Payer: Medicare Other | Admitting: Dermatology

## 2020-08-22 ENCOUNTER — Other Ambulatory Visit: Payer: Self-pay

## 2020-08-22 DIAGNOSIS — L309 Dermatitis, unspecified: Secondary | ICD-10-CM | POA: Diagnosis not present

## 2020-08-22 DIAGNOSIS — L409 Psoriasis, unspecified: Secondary | ICD-10-CM

## 2020-08-22 MED ORDER — CLOBETASOL PROPIONATE 0.05 % EX SOLN: 1.0000 "application " | Freq: Two times a day (BID) | CUTANEOUS | 0 refills | Status: DC

## 2020-08-22 NOTE — Patient Instructions (Addendum)
Chronic inflammation, rash, scale, and intense itching.  The most stubborn areas tend to be the bowl of the ear, distal ear canal, behind the ear, and scalp he did have some involvement under the arms which did clear. he was given triamcinolone by the PA at Dr. Ledell Peoples office but this did not help.  I explained that this is a localized variant of psoriasis and it can be quite stubborn on the scalp and the ears.  Christopher Hensley wife was kind enough to let me know that he is a habitual scratcher picker; unfortunately, this may totally prevent psoriasis from improving.  We will begin therapy with 4 weeks of once daily application after bathing of clobetasol scalp solution (if the pharmacist says this is not a preferred medicine they can refuse it and have the pharmacist call me0.  Follow-up will be scheduled in 1 to 2 months but if there is excellent improvement that visit is optional the prescription medicine should not be used on the face or the body folds like under the arm and the groin.

## 2020-09-20 ENCOUNTER — Encounter: Payer: Self-pay | Admitting: Dermatology

## 2020-09-20 NOTE — Progress Notes (Signed)
   Follow-Up Visit   Subjective  Christopher Hensley is a 86 y.o. male who presents for the following: Dermatitis (in the ears, behind the ears, all the head--noticed for months--was seen by Dr Lupton--Dx for seborric and contact dermatitis--was given triamcinolone 0.025%, ketoconazole 2%, fluocinonide 0.05%--no improvement).  Rash Location: Rash scalp and ears Duration: Months Quality:  Associated Signs/Symptoms: Intolerable itching Modifying Factors: 3 prescription medicines from Dr. Ledell Peoples office did not help much Severity:  Timing: Context:   Objective  Well appearing patient in no apparent distress; mood and affect are within normal limits.  A focused examination was performed including Head, neck, distal ear canal, elbows, fingernails.. Relevant physical exam findings are noted in the Assessment and Plan.   Assessment & Plan   Chronic inflammation, rash, scale, and intense itching.  The most stubborn areas tend to be the bowl of the ear, distal ear canal, behind the ear, and scalp he did have some involvement under the arms which did clear he was given triamcinolone by the PA at Dr. Ledell Peoples office but this did not help.  I explained that this is a localized variant of psoriasis and it can be quite stubborn on the scalp and the ears.  Christopher Hensley wife was kind enough to let me know that he is a habitual scratcher picker; unfortunately, this may totally prevent psoriasis from improving.  We will begin therapy with 4 weeks of once daily application after bathing of clobetasol scalp solution (if the pharmacist says this is not a preferred medicine they can refuse it and have the pharmacist call me.  Follow-up will be scheduled in 1 to 2 months but if there is excellent improvement that visit is optional the prescription medicine should not be used on the face or the body folds like under the arm and the groin. Psoriasis (3) Right Cymba; Left Concha; Scalp  Topical clobetasol daily  for 1 month (do not use on face).  Try to minimize picking and scratching.  Follow-up 1 month.  Dermatitis       I, Lavonna Monarch, MD, have reviewed all documentation for this visit.  The documentation on 09/20/20 for the exam, diagnosis, procedures, and orders are all accurate and complete.

## 2020-10-09 ENCOUNTER — Telehealth (HOSPITAL_COMMUNITY): Payer: Self-pay

## 2020-10-09 ENCOUNTER — Ambulatory Visit (HOSPITAL_COMMUNITY)
Admission: RE | Admit: 2020-10-09 | Discharge: 2020-10-09 | Disposition: A | Discharge status: Home / Self Care | Payer: Medicare Other | Source: Ambulatory Visit | Attending: Pulmonary Disease | Admitting: Pulmonary Disease

## 2020-10-09 ENCOUNTER — Other Ambulatory Visit: Payer: Self-pay | Admitting: Nurse Practitioner

## 2020-10-09 DIAGNOSIS — Z23 Encounter for immunization: Secondary | ICD-10-CM | POA: Insufficient documentation

## 2020-10-09 DIAGNOSIS — U071 COVID-19: Secondary | ICD-10-CM

## 2020-10-09 MED ORDER — SODIUM CHLORIDE 0.9 % IV SOLN: INTRAVENOUS | Status: DC | PRN

## 2020-10-09 MED ORDER — EPINEPHRINE 0.3 MG/0.3ML IJ SOAJ: 0.3000 mg | Freq: Once | INTRAMUSCULAR | Status: DC | PRN

## 2020-10-09 MED ORDER — METHYLPREDNISOLONE SODIUM SUCC 125 MG IJ SOLR: 125.0000 mg | Freq: Once | INTRAMUSCULAR | Status: DC | PRN

## 2020-10-09 MED ORDER — ALBUTEROL SULFATE HFA 108 (90 BASE) MCG/ACT IN AERS: 2.0000 | INHALATION_SPRAY | Freq: Once | RESPIRATORY_TRACT | Status: DC | PRN

## 2020-10-09 MED ORDER — SODIUM CHLORIDE 0.9 % IV SOLN: Freq: Once | INTRAVENOUS | Status: AC

## 2020-10-09 MED ORDER — DIPHENHYDRAMINE HCL 50 MG/ML IJ SOLN: 50.0000 mg | Freq: Once | INTRAMUSCULAR | Status: DC | PRN

## 2020-10-09 MED ORDER — FAMOTIDINE IN NACL 20-0.9 MG/50ML-% IV SOLN: 20.0000 mg | Freq: Once | INTRAVENOUS | Status: DC | PRN

## 2020-10-09 NOTE — Progress Notes (Signed)
I connected by phone with Christopher Hensley on 10/09/2020 at 2:42 PM to discuss the potential use of a new treatment for mild to moderate COVID-19 viral infection in non-hospitalized patients.  This patient is a 86 y.o. male that meets the FDA criteria for Emergency Use Authorization of COVID monoclonal antibody casirivimab/imdevimab or bamlanivimab/eteseviamb.  Has a (+) direct SARS-CoV-2 viral test result  Has mild or moderate COVID-19   Is NOT hospitalized due to COVID-19  Is within 10 days of symptom onset  Has at least one of the high risk factor(s) for progression to severe COVID-19 and/or hospitalization as defined in EUA.  Specific high risk criteria : Older age (>/= 84 yo)   I have spoken and communicated the following to the patient or parent/caregiver regarding COVID monoclonal antibody treatment:  1. FDA has authorized the emergency use for the treatment of mild to moderate COVID-19 in adults and pediatric patients with positive results of direct SARS-CoV-2 viral testing who are 37 years of age and older weighing at least 40 kg, and who are at high risk for progressing to severe COVID-19 and/or hospitalization.  2. The significant known and potential risks and benefits of COVID monoclonal antibody, and the extent to which such potential risks and benefits are unknown.  3. Information on available alternative treatments and the risks and benefits of those alternatives, including clinical trials.  4. Patients treated with COVID monoclonal antibody should continue to self-isolate and use infection control measures (e.g., wear mask, isolate, social distance, avoid sharing personal items, clean and disinfect "high touch" surfaces, and frequent handwashing) according to CDC guidelines.   5. The patient or parent/caregiver has the option to accept or refuse COVID monoclonal antibody treatment.  After reviewing this information with the patient, the patient has agreed to receive one  of the available covid 19 monoclonal antibodies and will be provided an appropriate fact sheet prior to infusion. Fenton Foy, NP 10/09/2020 2:42 PM

## 2020-10-09 NOTE — Progress Notes (Signed)
  Diagnosis: COVID-19  Physician: Dr. Joya Gaskins  Procedure: [facility] Infusion [facility] Med: bamlanivimab\etesevimab infusion - Provided patient with bamlanimivab\etesevimab fact sheet for patients, parents and caregivers prior to infusion.  Complications: No immediate complications noted.  Discharge: Discharged home   Christopher Hensley 10/09/2020

## 2020-10-09 NOTE — Telephone Encounter (Signed)
Called to Discuss with patient about Covid symptoms and the use of the monoclonal antibody infusion for those with mild to moderate Covid symptoms and at a high risk of hospitalization.     Pt appears to qualify for this infusion due to co-morbid conditions and/or a member of an at-risk group in accordance with the FDA Emergency Use Authorization.    Patient interested in treatment.  Sx onset 10/25. Tested (+) 10/26 at [facility]. Risk Factor: Obsese, Stage 4 Renal Failure, Age over 17.  Sx: Cough, fever, SOB.

## 2020-10-09 NOTE — Discharge Instructions (Signed)
10 Things You Can Do to Manage Your COVID-19 Symptoms at Home If you have possible or confirmed COVID-19: 1. Stay home from work and school. And stay away from other public places. If you must go out, avoid using any kind of public transportation, ridesharing, or taxis. 2. Monitor your symptoms carefully. If your symptoms get worse, call your healthcare provider immediately. 3. Get rest and stay hydrated. 4. If you have a medical appointment, call the healthcare provider ahead of time and tell them that you have or may have COVID-19. 5. For medical emergencies, call 911 and notify the dispatch personnel that you have or may have COVID-19. 6. Cover your cough and sneezes with a tissue or use the inside of your elbow. 7. Wash your hands often with soap and water for at least 20 seconds or clean your hands with an alcohol-based hand sanitizer that contains at least 60% alcohol. 8. As much as possible, stay in a specific room and away from other people in your home. Also, you should use a separate bathroom, if available. If you need to be around other people in or outside of the home, wear a mask. 9. Avoid sharing personal items with other people in your household, like dishes, towels, and bedding. 10. Clean all surfaces that are touched often, like counters, tabletops, and doorknobs. Use household cleaning sprays or wipes according to the label instructions. [email or web address] 06/14/2019 This information is not intended to replace advice given to you by your health care provider. Make sure you discuss any questions you have with your health care provider. Document Revised: 11/16/2019 Document Reviewed: 11/16/2019 Elsevier Patient Education  2020 [employer]. Mr. What types of side effects do monoclonal antibody drugs cause?  Common side effects  In general, the more common side effects caused by monoclonal antibody drugs include: . Allergic reactions, such as hives or itching . Flu-like signs  and symptoms, including chills, fatigue, fever, and muscle aches and pains . Nausea, vomiting . Diarrhea . Skin rashes . Low blood pressure   The CDC is recommending patients who receive monoclonal antibody treatments wait at least 90 days before being vaccinated.  Currently, there are no data on the safety and efficacy of mRNA COVID-19 vaccines in persons who received monoclonal antibodies or convalescent plasma as part of COVID-19 treatment. Based on the estimated half-life of such therapies as well as evidence suggesting that reinfection is uncommon in the 90 days after initial infection, vaccination should be deferred for at least 90 days, as a precautionary measure until additional information becomes available, to avoid interference of the antibody treatment with vaccine-induced immune responses.

## 2020-10-28 ENCOUNTER — Other Ambulatory Visit: Payer: Self-pay

## 2020-10-28 ENCOUNTER — Ambulatory Visit: Payer: Medicare Other | Admitting: Dermatology

## 2020-10-28 ENCOUNTER — Encounter: Payer: Self-pay | Admitting: Dermatology

## 2020-10-28 DIAGNOSIS — L409 Psoriasis, unspecified: Secondary | ICD-10-CM | POA: Diagnosis not present

## 2020-10-28 MED ORDER — CLOBETASOL PROP EMOLLIENT BASE 0.05 % EX CREA: 1.0000 "application " | TOPICAL_CREAM | Freq: Two times a day (BID) | CUTANEOUS | 2 refills | Status: DC

## 2020-11-01 ENCOUNTER — Encounter: Payer: Self-pay | Admitting: Dermatology

## 2020-11-01 ENCOUNTER — Telehealth: Payer: Self-pay | Admitting: Dermatology

## 2020-11-01 DIAGNOSIS — L409 Psoriasis, unspecified: Secondary | ICD-10-CM

## 2020-11-01 DIAGNOSIS — L309 Dermatitis, unspecified: Secondary | ICD-10-CM

## 2020-11-01 NOTE — Progress Notes (Signed)
° °  Follow-Up Visit   Subjective  Christopher Hensley is a 86 y.o. male who presents for the following: Psoriasis (2 month follow up for Psoriasis- better tx clobetasol cream).  Psoriasis Location: Scalp, and ears improved.  New spots hands. Duration:  Quality:  Associated Signs/Symptoms: Modifying Factors:  Severity:  Timing: Context:   Objective  Well appearing patient in no apparent distress; mood and affect are within normal limits.  A focused examination was performed including Head, neck, arms, hands, fingernails.. Relevant physical exam findings are noted in the Assessment and Plan.   Assessment & Plan    Psoriasis (3) Right Dorsal Hand; Left 4th Finger Metacarpophalangeal Joint; Mid Parietal Scalp  Continue with current topical that can also be used on the hands. If topical continues to work can refill topical for a year.  Avoid using this on the face and body folds.      I, Lavonna Monarch, MD, have reviewed all documentation for this visit.  The documentation on 11/01/20 for the exam, diagnosis, procedures, and orders are all accurate and complete.

## 2020-11-01 NOTE — Telephone Encounter (Signed)
Patient's daughter is calling to say that patient is having a hard time getting his Clobetasol prescription at [facility] and would like prescription switched to [facility] on [address] in [place].  The call back for the patient is his cell number [phone]

## 2020-11-04 MED ORDER — CLOBETASOL PROP EMOLLIENT BASE 0.05 % EX CREA: 1.0000 "application " | TOPICAL_CREAM | Freq: Two times a day (BID) | CUTANEOUS | 2 refills | Status: AC

## 2020-11-04 MED ORDER — CLOBETASOL PROPIONATE 0.05 % EX SOLN: 1.0000 "application " | Freq: Two times a day (BID) | CUTANEOUS | 2 refills | Status: DC

## 2020-11-04 NOTE — Telephone Encounter (Signed)
Phone call to patient to inform him that the prescription was sent to the [facility].  Message left on patient's voicemail with this information.

## 2020-12-18 DIAGNOSIS — I129 Hypertensive chronic kidney disease with stage 1 through stage 4 chronic kidney disease, or unspecified chronic kidney disease: Secondary | ICD-10-CM | POA: Diagnosis not present

## 2020-12-18 DIAGNOSIS — E7800 Pure hypercholesterolemia, unspecified: Secondary | ICD-10-CM | POA: Diagnosis not present

## 2020-12-18 DIAGNOSIS — E1122 Type 2 diabetes mellitus with diabetic chronic kidney disease: Secondary | ICD-10-CM | POA: Diagnosis not present

## 2020-12-18 DIAGNOSIS — N1832 Chronic kidney disease, stage 3b: Secondary | ICD-10-CM | POA: Diagnosis not present

## 2021-01-17 DIAGNOSIS — E1122 Type 2 diabetes mellitus with diabetic chronic kidney disease: Secondary | ICD-10-CM | POA: Diagnosis not present

## 2021-01-17 DIAGNOSIS — N1832 Chronic kidney disease, stage 3b: Secondary | ICD-10-CM | POA: Diagnosis not present

## 2021-01-17 DIAGNOSIS — E7800 Pure hypercholesterolemia, unspecified: Secondary | ICD-10-CM | POA: Diagnosis not present

## 2021-01-17 DIAGNOSIS — I129 Hypertensive chronic kidney disease with stage 1 through stage 4 chronic kidney disease, or unspecified chronic kidney disease: Secondary | ICD-10-CM | POA: Diagnosis not present

## 2021-03-19 DIAGNOSIS — E1122 Type 2 diabetes mellitus with diabetic chronic kidney disease: Secondary | ICD-10-CM | POA: Diagnosis not present

## 2021-03-19 DIAGNOSIS — D696 Thrombocytopenia, unspecified: Secondary | ICD-10-CM | POA: Diagnosis not present

## 2021-03-19 DIAGNOSIS — Z0000 Encounter for general adult medical examination without abnormal findings: Secondary | ICD-10-CM | POA: Diagnosis not present

## 2021-03-19 DIAGNOSIS — R109 Unspecified abdominal pain: Secondary | ICD-10-CM | POA: Diagnosis not present

## 2021-03-19 DIAGNOSIS — E7800 Pure hypercholesterolemia, unspecified: Secondary | ICD-10-CM | POA: Diagnosis not present

## 2021-03-19 DIAGNOSIS — Z1389 Encounter for screening for other disorder: Secondary | ICD-10-CM | POA: Diagnosis not present

## 2021-03-19 DIAGNOSIS — Z79899 Other long term (current) drug therapy: Secondary | ICD-10-CM | POA: Diagnosis not present

## 2021-03-19 DIAGNOSIS — E041 Nontoxic single thyroid nodule: Secondary | ICD-10-CM | POA: Diagnosis not present

## 2021-03-20 ENCOUNTER — Other Ambulatory Visit: Payer: Self-pay | Admitting: Geriatric Medicine

## 2021-03-20 DIAGNOSIS — R109 Unspecified abdominal pain: Secondary | ICD-10-CM

## 2021-04-04 ENCOUNTER — Ambulatory Visit
Admission: RE | Admit: 2021-04-04 | Discharge: 2021-04-04 | Disposition: A | Discharge status: Home / Self Care | Payer: Medicare Other | Source: Ambulatory Visit | Attending: Geriatric Medicine | Admitting: Geriatric Medicine

## 2021-04-04 DIAGNOSIS — R109 Unspecified abdominal pain: Secondary | ICD-10-CM

## 2021-04-04 DIAGNOSIS — N281 Cyst of kidney, acquired: Secondary | ICD-10-CM | POA: Diagnosis not present

## 2021-04-04 DIAGNOSIS — N2889 Other specified disorders of kidney and ureter: Secondary | ICD-10-CM | POA: Diagnosis not present

## 2021-04-04 DIAGNOSIS — K4090 Unilateral inguinal hernia, without obstruction or gangrene, not specified as recurrent: Secondary | ICD-10-CM | POA: Diagnosis not present

## 2021-04-04 DIAGNOSIS — K5750 Diverticulosis of both small and large intestine without perforation or abscess without bleeding: Secondary | ICD-10-CM | POA: Diagnosis not present

## 2021-04-04 MED ORDER — IOPAMIDOL (ISOVUE-300) INJECTION 61%
100.0000 mL | Freq: Once | INTRAVENOUS | Status: AC | PRN
  Administered 2021-04-04: 80 mL via INTRAVENOUS

## 2021-04-11 DIAGNOSIS — N1832 Chronic kidney disease, stage 3b: Secondary | ICD-10-CM | POA: Diagnosis not present

## 2021-04-11 DIAGNOSIS — E1122 Type 2 diabetes mellitus with diabetic chronic kidney disease: Secondary | ICD-10-CM | POA: Diagnosis not present

## 2021-04-11 DIAGNOSIS — E7800 Pure hypercholesterolemia, unspecified: Secondary | ICD-10-CM | POA: Diagnosis not present

## 2021-04-11 DIAGNOSIS — I129 Hypertensive chronic kidney disease with stage 1 through stage 4 chronic kidney disease, or unspecified chronic kidney disease: Secondary | ICD-10-CM | POA: Diagnosis not present

## 2021-05-05 DIAGNOSIS — E7800 Pure hypercholesterolemia, unspecified: Secondary | ICD-10-CM | POA: Diagnosis not present

## 2021-05-05 DIAGNOSIS — I129 Hypertensive chronic kidney disease with stage 1 through stage 4 chronic kidney disease, or unspecified chronic kidney disease: Secondary | ICD-10-CM | POA: Diagnosis not present

## 2021-05-05 DIAGNOSIS — N1832 Chronic kidney disease, stage 3b: Secondary | ICD-10-CM | POA: Diagnosis not present

## 2021-05-05 DIAGNOSIS — E1122 Type 2 diabetes mellitus with diabetic chronic kidney disease: Secondary | ICD-10-CM | POA: Diagnosis not present

## 2021-05-23 DIAGNOSIS — H04123 Dry eye syndrome of bilateral lacrimal glands: Secondary | ICD-10-CM | POA: Diagnosis not present

## 2021-05-23 DIAGNOSIS — H353111 Nonexudative age-related macular degeneration, right eye, early dry stage: Secondary | ICD-10-CM | POA: Diagnosis not present

## 2021-05-23 DIAGNOSIS — D3132 Benign neoplasm of left choroid: Secondary | ICD-10-CM | POA: Diagnosis not present

## 2021-05-23 DIAGNOSIS — E119 Type 2 diabetes mellitus without complications: Secondary | ICD-10-CM | POA: Diagnosis not present

## 2021-05-26 DIAGNOSIS — D49512 Neoplasm of unspecified behavior of left kidney: Secondary | ICD-10-CM | POA: Diagnosis not present

## 2021-05-26 DIAGNOSIS — D49511 Neoplasm of unspecified behavior of right kidney: Secondary | ICD-10-CM | POA: Diagnosis not present

## 2021-05-26 DIAGNOSIS — N1832 Chronic kidney disease, stage 3b: Secondary | ICD-10-CM | POA: Diagnosis not present

## 2021-06-04 DIAGNOSIS — N2889 Other specified disorders of kidney and ureter: Secondary | ICD-10-CM | POA: Diagnosis not present

## 2021-06-04 DIAGNOSIS — N1831 Chronic kidney disease, stage 3a: Secondary | ICD-10-CM | POA: Diagnosis not present

## 2021-06-04 DIAGNOSIS — R7303 Prediabetes: Secondary | ICD-10-CM | POA: Diagnosis not present

## 2021-06-04 DIAGNOSIS — I129 Hypertensive chronic kidney disease with stage 1 through stage 4 chronic kidney disease, or unspecified chronic kidney disease: Secondary | ICD-10-CM | POA: Diagnosis not present

## 2021-06-13 DIAGNOSIS — E7800 Pure hypercholesterolemia, unspecified: Secondary | ICD-10-CM | POA: Diagnosis not present

## 2021-06-13 DIAGNOSIS — E1122 Type 2 diabetes mellitus with diabetic chronic kidney disease: Secondary | ICD-10-CM | POA: Diagnosis not present

## 2021-06-13 DIAGNOSIS — N1832 Chronic kidney disease, stage 3b: Secondary | ICD-10-CM | POA: Diagnosis not present

## 2021-06-13 DIAGNOSIS — I129 Hypertensive chronic kidney disease with stage 1 through stage 4 chronic kidney disease, or unspecified chronic kidney disease: Secondary | ICD-10-CM | POA: Diagnosis not present

## 2021-08-06 ENCOUNTER — Other Ambulatory Visit: Payer: Self-pay

## 2021-08-06 DIAGNOSIS — U071 COVID-19: Secondary | ICD-10-CM

## 2021-08-07 ENCOUNTER — Ambulatory Visit (INDEPENDENT_AMBULATORY_CARE_PROVIDER_SITE_OTHER): Payer: Medicare Other

## 2021-08-07 DIAGNOSIS — U071 COVID-19: Secondary | ICD-10-CM

## 2021-08-07 MED ORDER — SODIUM CHLORIDE 0.9 % IV SOLN: INTRAVENOUS | Status: AC | PRN

## 2021-08-07 MED ORDER — EPINEPHRINE 0.3 MG/0.3ML IJ SOAJ: 0.3000 mg | Freq: Once | INTRAMUSCULAR | Status: AC | PRN

## 2021-08-07 MED ORDER — DIPHENHYDRAMINE HCL 50 MG/ML IJ SOLN: 50.0000 mg | Freq: Once | INTRAMUSCULAR | Status: AC | PRN

## 2021-08-07 MED ORDER — BEBTELOVIMAB 175 MG/2 ML IV (EUA)
175.0000 mg | Freq: Once | INTRAMUSCULAR | Status: AC
  Administered 2021-08-07: 175 mg via INTRAVENOUS

## 2021-08-07 MED ORDER — FAMOTIDINE IN NACL 20-0.9 MG/50ML-% IV SOLN: 20.0000 mg | Freq: Once | INTRAVENOUS | Status: AC | PRN

## 2021-08-07 MED ORDER — ALBUTEROL SULFATE HFA 108 (90 BASE) MCG/ACT IN AERS: 2.0000 | INHALATION_SPRAY | Freq: Once | RESPIRATORY_TRACT | Status: AC | PRN

## 2021-08-07 MED ORDER — METHYLPREDNISOLONE SODIUM SUCC 125 MG IJ SOLR: 125.0000 mg | Freq: Once | INTRAMUSCULAR | Status: AC | PRN

## 2021-08-07 NOTE — Patient Instructions (Signed)
10 Things You Can Do to Manage Your COVID-19 Symptoms at Home If you have possible or confirmed COVID-19 Stay home except to get medical care. Monitor your symptoms carefully. If your symptoms get worse, call your healthcare provider immediately. Get rest and stay hydrated. If you have a medical appointment, call the healthcare provider ahead of time and tell them that you have or may have COVID-19. For medical emergencies, call 911 and notify the dispatch personnel that you have or may have COVID-19. Cover your cough and sneezes with a tissue or use the inside of your elbow. Wash your hands often with soap and water for at least 20 seconds or clean your hands with an alcohol-based hand sanitizer that contains at least 60% alcohol. As much as possible, stay in a specific room and away from other people in your home. Also, you should use a separate bathroom, if available. If you need to be around other people in or outside of the home, wear a mask. Avoid sharing personal items with other people in your household, like dishes, towels, and bedding. Clean all surfaces that are touched often, like counters, tabletops, and doorknobs. Use household cleaning sprays or wipes according to the label instructions. [email or web address] 06/28/2020 This information is not intended to replace advice given to you by your health care provider. Make sure you discuss any questions you have with your health care provider. Document Revised: 04/17/2021 Document Reviewed: 04/17/2021 Elsevier Patient Education  2022 [employer].  Patient was given drug information sheet for [employer].  Also given cost estimate sheet for Bebtelovimab.  Patient reviewed documentation and questions were answered.  Patient would like to proceed with treatment at this time.

## 2021-08-07 NOTE — Progress Notes (Signed)
Diagnosis: COVID  Provider:  Marshell Garfinkel, MD  Procedure: Infusion  IV Type: Peripheral, IV Location: L Hand  Bebtelovimab, Dose: 175 mg  Infusion Start Time: [id number]  Infusion Stop Time: [id number]  Post Infusion IV Care: Observation period completed  Discharge: Condition: Good, Destination: Home . AVS provided to patient.   Performed by:  Alliene Klugh, Sherlon Handing, LPN

## 2021-09-01 DIAGNOSIS — E1122 Type 2 diabetes mellitus with diabetic chronic kidney disease: Secondary | ICD-10-CM | POA: Diagnosis not present

## 2021-09-01 DIAGNOSIS — Z23 Encounter for immunization: Secondary | ICD-10-CM | POA: Diagnosis not present

## 2021-09-01 DIAGNOSIS — I129 Hypertensive chronic kidney disease with stage 1 through stage 4 chronic kidney disease, or unspecified chronic kidney disease: Secondary | ICD-10-CM | POA: Diagnosis not present

## 2021-09-01 DIAGNOSIS — N1832 Chronic kidney disease, stage 3b: Secondary | ICD-10-CM | POA: Diagnosis not present

## 2021-11-03 DIAGNOSIS — M79602 Pain in left arm: Secondary | ICD-10-CM | POA: Diagnosis not present

## 2021-11-03 DIAGNOSIS — R0789 Other chest pain: Secondary | ICD-10-CM | POA: Diagnosis not present

## 2021-11-03 DIAGNOSIS — N1832 Chronic kidney disease, stage 3b: Secondary | ICD-10-CM | POA: Diagnosis not present

## 2021-11-03 DIAGNOSIS — R609 Edema, unspecified: Secondary | ICD-10-CM | POA: Diagnosis not present

## 2021-11-03 DIAGNOSIS — E7800 Pure hypercholesterolemia, unspecified: Secondary | ICD-10-CM | POA: Diagnosis not present

## 2021-11-03 DIAGNOSIS — I129 Hypertensive chronic kidney disease with stage 1 through stage 4 chronic kidney disease, or unspecified chronic kidney disease: Secondary | ICD-10-CM | POA: Diagnosis not present

## 2021-11-03 DIAGNOSIS — E1122 Type 2 diabetes mellitus with diabetic chronic kidney disease: Secondary | ICD-10-CM | POA: Diagnosis not present

## 2021-11-14 ENCOUNTER — Other Ambulatory Visit: Payer: Self-pay | Admitting: Urology

## 2021-11-14 DIAGNOSIS — D49511 Neoplasm of unspecified behavior of right kidney: Secondary | ICD-10-CM

## 2021-11-14 DIAGNOSIS — D49512 Neoplasm of unspecified behavior of left kidney: Secondary | ICD-10-CM

## 2021-11-19 NOTE — Progress Notes (Signed)
Cardiology Office Note:   Date:  11/20/2021  NAME:  Christopher Hensley    MRN: [id number] DOB:  11-Jun-1932   PCP:  Lajean Manes, MD  Cardiologist:  None  Electrophysiologist:  None   Referring MD: Johna Roles, PA   Chief Complaint  Patient presents with   Chest Pain   History of Present Illness:   Christopher Hensley is a 87 y.o. male with a hx of arthritis, HTN who is being seen today for the evaluation of chest pain at the request of Johna Roles, [place].  He presents with his wife.  Apparently has had chest pain for years.  He describes achy sensation in his chest.  His wife informed me that stress can bring it on.  Symptoms can last seconds and resolve without intervention.  He is not that active but has no exertional chest pain or pressure.  He does have lower extremity edema.  This is been going on for months.  He is morbidly obese with a BMI of 37.  He informs me that he does not watch his salt intake.  His wife says he put salt on everything.  EKG shows normal sinus rhythm and no acute ischemic changes or evidence of infarction.  He has never had a heart attack or stroke.  He has had heart catheterizations in the past that have been normal.  He is diabetic with an A1c of 6.7.  Most recent LDL cholesterol 35.  Blood pressure is elevated today from what I suspect his volume overload at 159/60.  He does take Lasix 20 mg daily.  He takes an extra tablet if needed.  He does not exercise but denies any exertional chest pain.  He does get short of breath when he exerts himself.  He reports he has cyst on his kidneys.  Apparently will be followed.  He does have CKD but this is controlled.  Cholesterol and diabetes seem to be the only thing that are well controlled.  No strong family history of heart disease.  He is a former smoker.  Does not drink alcohol or use drugs.  He is a retired salvage yard work  Problem List DM -A1c 6.7 2. HLD -T chol 90, HDL 32, LDL 35, TG 129 3. HTN 4. CKD 5.  Obesity  -BMI 37  Past Medical History: Past Medical History:  Diagnosis Date   Anemia    Angina    chest pain "anxiety related"   Anxiety    lost wife 01/2011   Arthritis    Blood transfusion    "last time 09/2011"   Cancer [facility])    prostate   Cancer of prostate ([place])    "had 17 radiation txs; last tx ~ 2004"   Cataract immature    "both eyes"   Chronic kidney disease 10/20/11   "go to dr twice a year to check my kidneys; don't know what for"   Depression 09/19/11    "always depressed"   Diabetes mellitus    "take pills"   Heart murmur    "as a child"   Hypertension    Neuromuscular disorder ([place])    "peripheral neuropathy"   Pneumonia    "probably as a child"   Rheumatic fever    "when I was a kid"   Sleep apnea 10/20/11   "have a machine; don't use it"    Past Surgical History: Past Surgical History:  Procedure Laterality Date   CARDIAC CATHETERIZATION  denies h/o stents   COLONOSCOPY WITH PROPOFOL N/A 06/14/2014   Procedure: COLONOSCOPY WITH PROPOFOL;  Surgeon: Juanita Craver, MD;  Location: [facility];  Service: Endoscopy;  Laterality: N/A;   JOINT REPLACEMENT  09/28/11   right knee replacement   JOINT REPLACEMENT  ~ 1990's   left knee replacement   PROSTATE CRYOABLATION     "dissolved it"   TONSILLECTOMY     "I think they took them out when I was a kid"    Current Medications: Current Meds  Medication Sig   allopurinol (ZYLOPRIM) 100 MG tablet Take 100 mg by mouth 2 (two) times daily.    amLODipine (NORVASC) 10 MG tablet Take 1 tablet by mouth daily.   aspirin 81 MG tablet Take 81 mg by mouth daily.   benzonatate (TESSALON) 100 MG capsule Take by mouth.   busPIRone (BUSPAR) 5 MG tablet Take 1 tablet by mouth daily.   furosemide (LASIX) 20 MG tablet Take 20 mg by mouth.   HYDROcodone bit-homatropine (HYCODAN) 5-1.5 MG/5ML syrup Take 5 mLs by mouth every 6 (six) hours as needed for cough.   Multiple Vitamin (MULTIVITAMIN WITH MINERALS) TABS tablet  Take 1 tablet by mouth daily.   olmesartan (BENICAR) 40 MG tablet Take 1 tablet by mouth daily.   Omega-3 Fatty Acids (FISH OIL) 1000 MG CAPS 1 capsule   rosuvastatin (CRESTOR) 5 MG tablet Take 5 mg by mouth daily.   spironolactone (ALDACTONE) 25 MG tablet TAKE ONE-HALF TABLET BY  MOUTH ONCE DAILY IN THE  MORNING   Current Facility-Administered Medications for the 11/20/21 encounter (Office Visit) with Geralynn Rile, MD  Medication   0.9 %  sodium chloride infusion     Allergies:    Oxycodone   Social History: Social History   Socioeconomic History   Marital status: Married    Spouse name: Not on file   Number of children: Not on file   Years of education: Not on file   Highest education level: Not on file  Occupational History   Occupation: Retired Artist  Tobacco Use   Smoking status: Never   Smokeless tobacco: Former    Types: Chew   Tobacco comments:    10/20/11 "for 70 years; not regular; carton lasts me > 1 month"  Substance and Sexual Activity   Alcohol use: No   Drug use: No   Sexual activity: Not on file  Other Topics Concern   Not on file  Social History Narrative   Not on file   Social Determinants of Health   Financial Resource Strain: Not on file  Food Insecurity: Not on file  Transportation Needs: Not on file  Physical Activity: Not on file  Stress: Not on file  Social Connections: Not on file     Family History: The patient's family history includes Heart disease in his father.  ROS:   All other ROS reviewed and negative. Pertinent positives noted in the HPI.     EKGs/Labs/Other Studies Reviewed:   The following studies were personally reviewed by me today:  EKG:  EKG is ordered today.  The ekg ordered today demonstrates normal sinus rhythm heart rate 72, no acute ischemic changes or evidence of infarction, and was personally reviewed by me.   Recent Labs: No results found for requested labs within last 8760 hours.   Recent  Lipid Panel    Component Value Date/Time   CHOL  11/25/2009 0455    147  ATP III CLASSIFICATION:  <200     mg/dL   Desirable  200-239  mg/dL   Borderline High  >=240    mg/dL   High          TRIG 173 (H) 11/25/2009 0455   HDL 29 (L) 11/25/2009 0455   CHOLHDL 5.1 11/25/2009 0455   VLDL 35 11/25/2009 0455   LDLCALC  11/25/2009 0455    83        Total Cholesterol/HDL:CHD Risk Coronary Heart Disease Risk Table                     Men   Women  1/2 Average Risk   3.4   3.3  Average Risk       5.0   4.4  2 X Average Risk   9.6   7.1  3 X Average Risk  23.4   11.0        Use the calculated Patient Ratio above and the CHD Risk Table to determine the patient's CHD Risk.        ATP III CLASSIFICATION (LDL):  <100     mg/dL   Optimal  100-129  mg/dL   Near or Above                    Optimal  130-159  mg/dL   Borderline  160-189  mg/dL   High  >190     mg/dL   Very High    Physical Exam:   VS:  BP (!) 159/60   Pulse 73   Ht 6' (1.829 m)   Wt 273 lb (123.8 kg)   SpO2 97%   BMI 37.03 kg/m    Wt Readings from Last 3 Encounters:  11/20/21 273 lb (123.8 kg)  06/14/14 262 lb (118.8 kg)  10/21/11 253 lb 1.4 oz (114.8 kg)    General: Well nourished, well developed, in no acute distress Head: Atraumatic, normal size  Eyes: PEERLA, EOMI  Neck: Supple, no JVD Endocrine: No thryomegaly Cardiac: Normal S1, S2; RRR; no murmurs, rubs, or gallops Lungs: Clear to auscultation bilaterally, no wheezing, rhonchi or rales  Abd: Soft, nontender, no hepatomegaly  Ext: 2+ pitting edema Musculoskeletal: No deformities, BUE and BLE strength normal and equal Skin: Warm and dry, no rashes   Neuro: Alert and oriented to person, place, time, and situation, CNII-XII grossly intact, no focal deficits  Psych: Normal mood and affect   ASSESSMENT:   Christopher Hensley is a 87 y.o. male who presents for the following: 1. Precordial pain   2. SOB (shortness of breath)   3. Edema, unspecified  type     PLAN:   1. Precordial pain -Suspect noncardiac chest pain.  EKG shows normal sinus rhythm with no acute ischemic changes.  He has had left heart catheterizations in the past have been normal.  Stress test in 2010 was normal.  His symptoms are brought on by stress.  They do not occur with exertion.  I would like for him to obtain a Lexiscan nuclear medicine stress test just to make sure this is not coming from his heart.  Risk factors include hypertension and morbid obesity.  He also is diabetic.  2. SOB (shortness of breath) 3. Edema, unspecified type -He describes exertional shortness of breath as well as lower extremity edema.  He appears a bit volume up on exam today.  I think his lower extreme edema could be related to excess salt consumption  and venous insufficiency.  We also need to exclude congestive heart failure.  I would like to obtain a BNP and BMP.  He also needs an echocardiogram.  For now we will continue with Lasix 20 mg daily and an extra tablet as needed in the afternoon. -The biggest issue for him is salt.  He has not reducing the salt intake.  We will give him information on salt reductive strategies.  He cannot eat out.  He understands this is making his condition worse.  I suspect this is also worsening his blood pressure.  I will plan to see him back in 3 months.  Hopefully he will adhere to a low-salt diet.  Shared Decision Making/Informed Consent The risks [chest pain, shortness of breath, cardiac arrhythmias, dizziness, blood pressure fluctuations, myocardial infarction, stroke/transient ischemic attack, nausea, vomiting, allergic reaction, radiation exposure, metallic taste sensation and life-threatening complications (estimated to be 1 in 10,000)], benefits (risk stratification, diagnosing coronary artery disease, treatment guidance) and alternatives of a nuclear stress test were discussed in detail with Christopher Hensley and he agrees to proceed.  Disposition: No  follow-ups on file.  Medication Adjustments/Labs and Tests Ordered: Current medicines are reviewed at length with the patient today.  Concerns regarding medicines are outlined above.  Orders Placed This Encounter  Procedures   Brain natriuretic peptide   Basic metabolic panel   Cardiac Stress Test: Informed Consent Details: Physician/Practitioner Attestation; Transcribe to consent form and obtain patient signature   MYOCARDIAL PERFUSION IMAGING   EKG 12-Lead   ECHOCARDIOGRAM COMPLETE    No orders of the defined types were placed in this encounter.   Patient Instructions  Medication Instructions:  The current medical regimen is effective;  continue present plan and medications.  *If you need a refill on your cardiac medications before your next appointment, please call your pharmacy*   Lab Work: BNP, BMET today   If you have labs (blood work) drawn today and your tests are completely normal, you will receive your results only by: [place] (if you have MyChart) OR A paper copy in the mail If you have any lab test that is abnormal or we need to change your treatment, we will call you to review the results.   Testing/Procedures: Echocardiogram - Your physician has requested that you have an echocardiogram. Echocardiography is a painless test that uses sound waves to create images of your heart. It provides your doctor with information about the size and shape of your heart and how well your heart's chambers and valves are working. This procedure takes approximately one hour. There are no restrictions for this procedure. This will be performed at either our [facility] location - [address] [place] location [facility] 2nd floor.   Your physician has requested that you have a lexiscan myoview. For further information please visit [email or web address]. Please follow instruction sheet, as given.    Follow-Up: At [facility], you and your  health needs are our priority.  As part of our continuing mission to provide you with exceptional heart care, we have created designated Provider Care Teams.  These Care Teams include your primary Cardiologist (physician) and Advanced Practice Providers (APPs -  Physician Assistants and Nurse Practitioners) who all work together to provide you with the care you need, when you need it.  We recommend signing up for the patient portal called "MyChart".  Sign up information is provided on this After Visit Summary.  MyChart is used to  connect with patients for Virtual Visits (Telemedicine).  Patients are able to view lab/test results, encounter notes, upcoming appointments, etc.  Non-urgent messages can be sent to your provider as well.   To learn more about what you can do with MyChart, go to [email or web address].    Your next appointment:   3 month(s)  The format for your next appointment:   In Person  Provider:   Eleonore Chiquito, MD      Signed, Addison Naegeli. Audie Box, MD, [place]  [address] [place], [place] [address] [phone]  11/20/2021 4:02 PM

## 2021-11-20 ENCOUNTER — Encounter: Payer: Self-pay | Admitting: Cardiovascular Disease

## 2021-11-20 ENCOUNTER — Other Ambulatory Visit: Payer: Self-pay

## 2021-11-20 ENCOUNTER — Ambulatory Visit: Payer: Medicare Other | Admitting: Cardiovascular Disease

## 2021-11-20 VITALS — BP 159/60 | HR 73 | Ht 72.0 in | Wt 273.0 lb

## 2021-11-20 DIAGNOSIS — R609 Edema, unspecified: Secondary | ICD-10-CM | POA: Diagnosis not present

## 2021-11-20 DIAGNOSIS — R072 Precordial pain: Secondary | ICD-10-CM | POA: Diagnosis not present

## 2021-11-20 DIAGNOSIS — R0602 Shortness of breath: Secondary | ICD-10-CM | POA: Diagnosis not present

## 2021-11-20 NOTE — Patient Instructions (Addendum)
Medication Instructions:  The current medical regimen is effective;  continue present plan and medications.  *If you need a refill on your cardiac medications before your next appointment, please call your pharmacy*   Lab Work: BNP, BMET today   If you have labs (blood work) drawn today and your tests are completely normal, you will receive your results only by: [place] (if you have MyChart) OR A paper copy in the mail If you have any lab test that is abnormal or we need to change your treatment, we will call you to review the results.   Testing/Procedures: Echocardiogram - Your physician has requested that you have an echocardiogram. Echocardiography is a painless test that uses sound waves to create images of your heart. It provides your doctor with information about the size and shape of your heart and how well your heart's chambers and valves are working. This procedure takes approximately one hour. There are no restrictions for this procedure. This will be performed at either our [facility] location - [address] [place] location [facility] 2nd floor.   Your physician has requested that you have a lexiscan myoview. For further information please visit [email or web address]. Please follow instruction sheet, as given.    Follow-Up: At [facility], you and your health needs are our priority.  As part of our continuing mission to provide you with exceptional heart care, we have created designated Provider Care Teams.  These Care Teams include your primary Cardiologist (physician) and Advanced Practice Providers (APPs -  Physician Assistants and Nurse Practitioners) who all work together to provide you with the care you need, when you need it.  We recommend signing up for the patient portal called "MyChart".  Sign up information is provided on this After Visit Summary.  MyChart is used to connect with patients for Virtual Visits (Telemedicine).   Patients are able to view lab/test results, encounter notes, upcoming appointments, etc.  Non-urgent messages can be sent to your provider as well.   To learn more about what you can do with MyChart, go to [email or web address].    Your next appointment:   3 month(s)  The format for your next appointment:   In Person  Provider:   Eleonore Chiquito, MD

## 2021-11-21 LAB — BASIC METABOLIC PANEL
BUN/Creatinine Ratio: 23 (ref 10–24)
BUN: 34 mg/dL — ABNORMAL HIGH (ref 8–27)
CO2: 20 mmol/L (ref 20–29)
Calcium: 8.7 mg/dL (ref 8.6–10.2)
Chloride: 104 mmol/L (ref 96–106)
Creatinine, Ser: 1.46 mg/dL — ABNORMAL HIGH (ref 0.76–1.27)
Glucose: 112 mg/dL — ABNORMAL HIGH (ref 70–99)
Potassium: 4.7 mmol/L (ref 3.5–5.2)
Sodium: 141 mmol/L (ref 134–144)
eGFR: 46 mL/min/{1.73_m2} — ABNORMAL LOW (ref >59)

## 2021-11-21 LAB — BRAIN NATRIURETIC PEPTIDE: BNP: 65.1 pg/mL (ref 0.0–100.0)

## 2021-11-27 ENCOUNTER — Telehealth (HOSPITAL_COMMUNITY): Payer: Self-pay | Admitting: *Deleted

## 2021-11-27 NOTE — Telephone Encounter (Signed)
Close encounter 

## 2021-11-30 ENCOUNTER — Ambulatory Visit
Admission: RE | Admit: 2021-11-30 | Discharge: 2021-11-30 | Disposition: A | Discharge status: Home / Self Care | Payer: Medicare Other | Source: Ambulatory Visit | Attending: Urology | Admitting: Urology

## 2021-11-30 DIAGNOSIS — D49511 Neoplasm of unspecified behavior of right kidney: Secondary | ICD-10-CM

## 2021-11-30 DIAGNOSIS — N2889 Other specified disorders of kidney and ureter: Secondary | ICD-10-CM | POA: Diagnosis not present

## 2021-11-30 DIAGNOSIS — D49512 Neoplasm of unspecified behavior of left kidney: Secondary | ICD-10-CM

## 2021-11-30 MED ORDER — GADOBENATE DIMEGLUMINE 529 MG/ML IV SOLN
20.0000 mL | Freq: Once | INTRAVENOUS | Status: AC | PRN
  Administered 2021-11-30: 13:00:00 20 mL via INTRAVENOUS

## 2021-12-02 ENCOUNTER — Other Ambulatory Visit: Payer: Self-pay

## 2021-12-02 ENCOUNTER — Ambulatory Visit (HOSPITAL_COMMUNITY)
Admission: RE | Admit: 2021-12-02 | Discharge: 2021-12-02 | Disposition: A | Discharge status: Home / Self Care | Payer: Medicare Other | Source: Ambulatory Visit | Attending: Cardiovascular Disease | Admitting: Cardiovascular Disease

## 2021-12-02 DIAGNOSIS — R072 Precordial pain: Secondary | ICD-10-CM | POA: Diagnosis not present

## 2021-12-02 LAB — MYOCARDIAL PERFUSION IMAGING
LV dias vol: 119 mL (ref 62–150)
LV sys vol: 44 mL
Nuc Stress EF: 63 %
Peak HR: 82 {beats}/min
Rest HR: 67 {beats}/min
Rest Nuclear Isotope Dose: 10.4 mCi
SDS: 0
SRS: 0
SSS: 0
ST Depression (mm): 0 mm
Stress Nuclear Isotope Dose: 29.3 mCi
TID: 1.11

## 2021-12-02 MED ORDER — TECHNETIUM TC 99M TETROFOSMIN IV KIT
29.3000 | PACK | Freq: Once | INTRAVENOUS | Status: AC | PRN
  Administered 2021-12-02: 29.3 via INTRAVENOUS
  Filled 2021-12-02: qty 30

## 2021-12-02 MED ORDER — REGADENOSON 0.4 MG/5ML IV SOLN
0.4000 mg | Freq: Once | INTRAVENOUS | Status: AC
  Administered 2021-12-02: 0.4 mg via INTRAVENOUS

## 2021-12-02 MED ORDER — TECHNETIUM TC 99M TETROFOSMIN IV KIT
10.4000 | PACK | Freq: Once | INTRAVENOUS | Status: AC | PRN
  Administered 2021-12-02: 10.4 via INTRAVENOUS
  Filled 2021-12-02: qty 11

## 2021-12-03 ENCOUNTER — Inpatient Hospital Stay (HOSPITAL_COMMUNITY): Admission: RE | Admit: 2021-12-03 | Payer: Medicare Other | Source: Ambulatory Visit

## 2021-12-09 DIAGNOSIS — H02834 Dermatochalasis of left upper eyelid: Secondary | ICD-10-CM | POA: Diagnosis not present

## 2021-12-09 DIAGNOSIS — H35033 Hypertensive retinopathy, bilateral: Secondary | ICD-10-CM | POA: Diagnosis not present

## 2021-12-09 DIAGNOSIS — E119 Type 2 diabetes mellitus without complications: Secondary | ICD-10-CM | POA: Diagnosis not present

## 2021-12-09 DIAGNOSIS — H04123 Dry eye syndrome of bilateral lacrimal glands: Secondary | ICD-10-CM | POA: Diagnosis not present

## 2021-12-09 DIAGNOSIS — H353132 Nonexudative age-related macular degeneration, bilateral, intermediate dry stage: Secondary | ICD-10-CM | POA: Diagnosis not present

## 2021-12-17 ENCOUNTER — Ambulatory Visit (HOSPITAL_COMMUNITY): Payer: Medicare Other | Attending: Cardiology

## 2021-12-17 ENCOUNTER — Other Ambulatory Visit: Payer: Self-pay

## 2021-12-17 DIAGNOSIS — R0602 Shortness of breath: Secondary | ICD-10-CM | POA: Diagnosis not present

## 2021-12-17 LAB — ECHOCARDIOGRAM COMPLETE
Area-P 1/2: 3.31 cm2
S' Lateral: 3.1 cm

## 2021-12-17 MED ORDER — PERFLUTREN LIPID MICROSPHERE
1.0000 mL | INTRAVENOUS | Status: AC | PRN
  Administered 2021-12-17: 3 mL via INTRAVENOUS

## 2021-12-29 DIAGNOSIS — I129 Hypertensive chronic kidney disease with stage 1 through stage 4 chronic kidney disease, or unspecified chronic kidney disease: Secondary | ICD-10-CM | POA: Diagnosis not present

## 2021-12-29 DIAGNOSIS — E7800 Pure hypercholesterolemia, unspecified: Secondary | ICD-10-CM | POA: Diagnosis not present

## 2021-12-29 DIAGNOSIS — E1122 Type 2 diabetes mellitus with diabetic chronic kidney disease: Secondary | ICD-10-CM | POA: Diagnosis not present

## 2021-12-29 DIAGNOSIS — N1832 Chronic kidney disease, stage 3b: Secondary | ICD-10-CM | POA: Diagnosis not present

## 2022-01-08 DIAGNOSIS — D49512 Neoplasm of unspecified behavior of left kidney: Secondary | ICD-10-CM | POA: Diagnosis not present

## 2022-01-15 DIAGNOSIS — N6459 Other signs and symptoms in breast: Secondary | ICD-10-CM | POA: Diagnosis not present

## 2022-01-15 DIAGNOSIS — N644 Mastodynia: Secondary | ICD-10-CM | POA: Diagnosis not present

## 2022-01-28 DIAGNOSIS — N62 Hypertrophy of breast: Secondary | ICD-10-CM | POA: Diagnosis not present

## 2022-01-28 DIAGNOSIS — E7800 Pure hypercholesterolemia, unspecified: Secondary | ICD-10-CM | POA: Diagnosis not present

## 2022-01-28 DIAGNOSIS — D696 Thrombocytopenia, unspecified: Secondary | ICD-10-CM | POA: Diagnosis not present

## 2022-01-28 DIAGNOSIS — E1122 Type 2 diabetes mellitus with diabetic chronic kidney disease: Secondary | ICD-10-CM | POA: Diagnosis not present

## 2022-01-28 DIAGNOSIS — I700 Atherosclerosis of aorta: Secondary | ICD-10-CM | POA: Diagnosis not present

## 2022-01-28 DIAGNOSIS — N1832 Chronic kidney disease, stage 3b: Secondary | ICD-10-CM | POA: Diagnosis not present

## 2022-01-28 DIAGNOSIS — I129 Hypertensive chronic kidney disease with stage 1 through stage 4 chronic kidney disease, or unspecified chronic kidney disease: Secondary | ICD-10-CM | POA: Diagnosis not present

## 2022-03-03 DIAGNOSIS — N1832 Chronic kidney disease, stage 3b: Secondary | ICD-10-CM | POA: Diagnosis not present

## 2022-03-03 DIAGNOSIS — I129 Hypertensive chronic kidney disease with stage 1 through stage 4 chronic kidney disease, or unspecified chronic kidney disease: Secondary | ICD-10-CM | POA: Diagnosis not present

## 2022-03-03 DIAGNOSIS — E8770 Fluid overload, unspecified: Secondary | ICD-10-CM | POA: Diagnosis not present

## 2022-03-03 NOTE — Progress Notes (Signed)
?Cardiology Office Note:   ?Date:  03/05/2022  ?NAME:  Christopher Hensley    ?MRN: [id number] ?DOB:  [birth year]  ? ?PCP:  Lajean Manes, MD  ?Cardiologist:  None  ?Electrophysiologist:  None  ? ?Referring MD: Lajean Manes, MD  ? ?Chief Complaint  ?Patient presents with  ? Follow-up  ?   ?  ? ?History of Present Illness:   ?Christopher Hensley is a 88 y.o. male with a hx of obesity, DM, HTN, HLD who presents for follow-up. Seen in December for SOB and LE edema. Stress test normal. Echo normal.  ? ?Still getting short of breath.  Spironolactone was stopped due to concerns for gynecomastia by his primary care physician.  Remains volume up.  Weights are increasing.  On Lasix 40 mg twice daily.  Started on this by his primary care physician yesterday.  We discussed trying eplerenone.  This is similar to Aldactone but does not have the side effect of gynecomastia.  He is okay to do this.  His blood pressure is well controlled.  Echo normal.  Stress test with no concern for CAD.  He has had numerous left heart cath in the past that are been normal. ? ?He eats restaurants and fast food basically every meal.  Goes to [employer] as well as other places that are high in salty content.  I believe this is contributing immensely to his fluid.  We also discussed compression stockings.  He is noncompliant with them.  Really unsure what we can do if he continues to consume high salty meals.  I believe this is contributing. ? ?Problem List ?DM ?-A1c 6.7 ?2. HLD ?-T chol 90, HDL 32, LDL 35, TG 129 ?3. HTN ?4. CKD ?5. Obesity  ?-BMI 37 ? ?Past Medical History: ?Past Medical History:  ?Diagnosis Date  ? Anemia   ? Angina   ? chest pain "anxiety related"  ? Anxiety   ? lost wife 01/2011  ? Arthritis   ? Blood transfusion   ? "last time 09/2011"  ? Cancer [facility])   ? prostate  ? Cancer of prostate [facility])   ? "had 38 radiation txs; last tx ~ 2004"  ? Cataract immature   ? "both eyes"  ? Chronic kidney disease 10/20/11  ? "go to dr twice a year  to check my kidneys; don't know what for"  ? Depression 09/19/11   ? "always depressed"  ? Diabetes mellitus   ? "take pills"  ? Heart murmur   ? "as a child"  ? Hypertension   ? Neuromuscular disorder ([place])   ? "peripheral neuropathy"  ? Pneumonia   ? "probably as a child"  ? Rheumatic fever   ? "when I was a kid"  ? Sleep apnea 10/20/11  ? "have a machine; don't use it"  ? ? ?Past Surgical History: ?Past Surgical History:  ?Procedure Laterality Date  ? CARDIAC CATHETERIZATION    ? denies h/o stents  ? COLONOSCOPY WITH PROPOFOL N/A 06/14/2014  ? Procedure: COLONOSCOPY WITH PROPOFOL;  Surgeon: Juanita Craver, MD;  [facility]: [facility];  Service: Endoscopy;  Laterality: N/A;  ? JOINT REPLACEMENT  09/28/11  ? right knee replacement  ? JOINT REPLACEMENT  ~ 1990's  ? left knee replacement  ? PROSTATE CRYOABLATION    ? "dissolved it"  ? TONSILLECTOMY    ? "I think they took them out when I was a kid"  ? ? ?Current Medications: ?Current Meds  ?Medication Sig  ? allopurinol (ZYLOPRIM)  100 MG tablet Take 100 mg by mouth 2 (two) times daily.   ? amLODipine (NORVASC) 5 MG tablet Take 5 mg by mouth every morning.  ? aspirin 81 MG tablet Take 81 mg by mouth daily.  ? busPIRone (BUSPAR) 5 MG tablet Take 2 tablets by mouth daily.  ? eplerenone (INSPRA) 25 MG tablet Take 1 tablet (25 mg total) by mouth daily.  ? furosemide (LASIX) 20 MG tablet Take 20 mg by mouth 2 (two) times daily. Take 2 tablets twice daily  ? Multiple Vitamin (MULTIVITAMIN WITH MINERALS) TABS tablet Take 1 tablet by mouth daily.  ? olmesartan (BENICAR) 40 MG tablet Take 1 tablet by mouth daily.  ? Omega-3 Fatty Acids (FISH OIL) 1000 MG CAPS 1 capsule  ? rosuvastatin (CRESTOR) 5 MG tablet Take 5 mg by mouth daily.  ? ?Current Facility-Administered Medications for the 03/05/22 encounter (Office Visit) with Geralynn Rile, MD  ?Medication  ? 0.9 %  sodium chloride infusion  ?  ? ?Allergies:    ?Oxycodone  ? ?Social History: ?Social History  ? ?Socioeconomic  History  ? Marital status: Married  ?  Spouse name: Not on file  ? Number of children: Not on file  ? Years of education: Not on file  ? Highest education level: Not on file  ?Occupational History  ? Occupation: Retired Artist  ?Tobacco Use  ? Smoking status: Never  ? Smokeless tobacco: Former  ?  Types: Chew  ? Tobacco comments:  ?  10/20/11 "for 70 years; not regular; carton lasts me > 1 month"  ?Substance and Sexual Activity  ? Alcohol use: No  ? Drug use: No  ? Sexual activity: Not on file  ?Other Topics Concern  ? Not on file  ?Social History Narrative  ? Not on file  ? ?Social Determinants of Health  ? ?Financial Resource Strain: Not on file  ?Food Insecurity: Not on file  ?Transportation Needs: Not on file  ?Physical Activity: Not on file  ?Stress: Not on file  ?Social Connections: Not on file  ?  ? ?Family History: ?The patient's family history includes Heart disease in his father. ? ?ROS:   ?All other ROS reviewed and negative. Pertinent positives noted in the HPI.    ? ?EKGs/Labs/Other Studies Reviewed:   ?The following studies were personally reviewed by me today: ? ?NM Stress 12/02/2021 ?  The study is normal. The study is low risk. ?  No ST deviation was noted. ?  LV perfusion is normal. There is no evidence of ischemia. There is no evidence of infarction. ?  Left ventricular function is normal. Nuclear stress EF: 63 %. The left ventricular ejection fraction is normal (55-65%). End diastolic cavity size is normal. End systolic cavity size is normal. ?  Prior study not available for comparison. ? ?TTE 12/17/2021 ? 1. Left ventricular ejection fraction, by estimation, is 60 to 65%. The  ?left ventricle has normal function. The left ventricle has no regional  ?wall motion abnormalities. There is mild left ventricular hypertrophy.  ?Left ventricular diastolic parameters  ?are consistent with Grade I diastolic dysfunction (impaired relaxation).  ? 2. Right ventricular systolic function is normal. The  right ventricular  ?size is normal.  ? 3. The mitral valve is normal in structure. No evidence of mitral valve  ?regurgitation. No evidence of mitral stenosis.  ? 4. The aortic valve is tricuspid. There is mild calcification of the  ?aortic valve. There is mild thickening of the aortic  valve. Aortic valve  ?regurgitation is not visualized. Aortic valve sclerosis is present, with  ?no evidence of aortic valve stenosis.  ? 5. The inferior vena cava is normal in size with greater than 50%  ?respiratory variability, suggesting right atrial pressure of 3 mmHg.  ? ?Recent Labs: ?11/20/2021: BNP 65.1; BUN 34; Creatinine, Ser 1.46; Potassium 4.7; Sodium 141  ? ?Recent Lipid Panel ?   ?Component Value Date/Time  ? CHOL  11/25/2009 0455  ?  147        ?ATP III CLASSIFICATION: ? <200     mg/dL   Desirable ? 200-239  mg/dL   Borderline High ? >=240    mg/dL   High ?        ? TRIG 173 (H) 11/25/2009 0455  ? HDL 29 (L) 11/25/2009 0455  ? CHOLHDL 5.1 11/25/2009 0455  ? VLDL 35 11/25/2009 0455  ? [place]  11/25/2009 0455  ?  83        ?Total Cholesterol/HDL:CHD Risk ?Coronary Heart Disease Risk Table ?                    Men   Women ? 1/2 Average Risk   3.4   3.3 ? Average Risk       5.0   4.4 ? 2 X Average Risk   9.6   7.1 ? 3 X Average Risk  23.4   11.0 ?       ?Use the calculated Patient Ratio ?above and the CHD Risk Table ?to determine the patient's CHD Risk. ?       ?ATP III CLASSIFICATION (LDL): ? <100     mg/dL   Optimal ? 100-129  mg/dL   Near or Above ?                   Optimal ? 130-159  mg/dL   Borderline ? 160-189  mg/dL   High ? >190     mg/dL   Very High  ? ? ?Physical Exam:   ?VS:  BP 124/62   Pulse 64   Ht 6' (1.829 m)   Wt 282 lb 6.4 oz (128.1 kg)   SpO2 94%   BMI 38.30 kg/m?    ?Wt Readings from Last 3 Encounters:  ?03/05/22 282 lb 6.4 oz (128.1 kg)  ?12/02/21 273 lb (123.8 kg)  ?11/20/21 273 lb (123.8 kg)  ?  ?General: Well nourished, well developed, in no acute distress ?Head: Atraumatic, normal size   ?Eyes: PEERLA, EOMI  ?Neck: Supple, no JVD ?Endocrine: No thryomegaly ?Cardiac: Normal S1, S2; RRR; no murmurs, rubs, or gallops ?Lungs: Clear to auscultation bilaterally, no wheezing, rhonchi or rales  ?Abd: Soft, nont

## 2022-03-05 ENCOUNTER — Ambulatory Visit: Payer: Medicare Other | Admitting: Cardiovascular Disease

## 2022-03-05 ENCOUNTER — Other Ambulatory Visit: Payer: Self-pay

## 2022-03-05 ENCOUNTER — Encounter: Payer: Self-pay | Admitting: Cardiovascular Disease

## 2022-03-05 VITALS — BP 124/62 | HR 64 | Ht 72.0 in | Wt 282.4 lb

## 2022-03-05 DIAGNOSIS — R609 Edema, unspecified: Secondary | ICD-10-CM | POA: Diagnosis not present

## 2022-03-05 DIAGNOSIS — I10 Essential (primary) hypertension: Secondary | ICD-10-CM | POA: Diagnosis not present

## 2022-03-05 DIAGNOSIS — E782 Mixed hyperlipidemia: Secondary | ICD-10-CM

## 2022-03-05 DIAGNOSIS — R0602 Shortness of breath: Secondary | ICD-10-CM | POA: Diagnosis not present

## 2022-03-05 MED ORDER — EPLERENONE 25 MG PO TABS: 25.0000 mg | ORAL_TABLET | Freq: Every day | ORAL | 1 refills | Status: DC

## 2022-03-05 NOTE — Patient Instructions (Addendum)
Medication Instructions:  ?START Eplerenone (Inspra) 25 mg daily  ? ?*If you need a refill on your cardiac medications before your next appointment, please call your pharmacy* ? ? ?Follow-Up: ?At [facility], you and your health needs are our priority.  As part of our continuing mission to provide you with exceptional heart care, we have created designated Provider Care Teams.  These Care Teams include your primary Cardiologist (physician) and Advanced Practice Providers (APPs -  Physician Assistants and Nurse Practitioners) who all work together to provide you with the care you need, when you need it. ? ?We recommend signing up for the patient portal called "MyChart".  Sign up information is provided on this After Visit Summary.  MyChart is used to connect with patients for Virtual Visits (Telemedicine).  Patients are able to view lab/test results, encounter notes, upcoming appointments, etc.  Non-urgent messages can be sent to your provider as well.   ?To learn more about what you can do with MyChart, go to [email or web address].   ? ?Your next appointment:   ?6 month(s) ? ?The format for your next appointment:   ?In Person ? ?Provider:   ?Eleonore Chiquito, MD  ? ? ? ?

## 2022-03-25 DIAGNOSIS — E1122 Type 2 diabetes mellitus with diabetic chronic kidney disease: Secondary | ICD-10-CM | POA: Diagnosis not present

## 2022-03-25 DIAGNOSIS — N1832 Chronic kidney disease, stage 3b: Secondary | ICD-10-CM | POA: Diagnosis not present

## 2022-03-25 DIAGNOSIS — E7800 Pure hypercholesterolemia, unspecified: Secondary | ICD-10-CM | POA: Diagnosis not present

## 2022-03-25 DIAGNOSIS — D696 Thrombocytopenia, unspecified: Secondary | ICD-10-CM | POA: Diagnosis not present

## 2022-03-25 DIAGNOSIS — I129 Hypertensive chronic kidney disease with stage 1 through stage 4 chronic kidney disease, or unspecified chronic kidney disease: Secondary | ICD-10-CM | POA: Diagnosis not present

## 2022-03-25 DIAGNOSIS — B372 Candidiasis of skin and nail: Secondary | ICD-10-CM | POA: Diagnosis not present

## 2022-03-25 DIAGNOSIS — Z1389 Encounter for screening for other disorder: Secondary | ICD-10-CM | POA: Diagnosis not present

## 2022-03-25 DIAGNOSIS — Z79899 Other long term (current) drug therapy: Secondary | ICD-10-CM | POA: Diagnosis not present

## 2022-03-25 DIAGNOSIS — Z0000 Encounter for general adult medical examination without abnormal findings: Secondary | ICD-10-CM | POA: Diagnosis not present

## 2022-05-12 DIAGNOSIS — N1832 Chronic kidney disease, stage 3b: Secondary | ICD-10-CM | POA: Diagnosis not present

## 2022-05-18 DIAGNOSIS — R7303 Prediabetes: Secondary | ICD-10-CM | POA: Diagnosis not present

## 2022-05-18 DIAGNOSIS — N1832 Chronic kidney disease, stage 3b: Secondary | ICD-10-CM | POA: Diagnosis not present

## 2022-05-18 DIAGNOSIS — I129 Hypertensive chronic kidney disease with stage 1 through stage 4 chronic kidney disease, or unspecified chronic kidney disease: Secondary | ICD-10-CM | POA: Diagnosis not present

## 2022-05-18 DIAGNOSIS — N2889 Other specified disorders of kidney and ureter: Secondary | ICD-10-CM | POA: Diagnosis not present

## 2022-05-27 ENCOUNTER — Telehealth: Payer: Self-pay

## 2022-05-27 DIAGNOSIS — L409 Psoriasis, unspecified: Secondary | ICD-10-CM

## 2022-05-27 DIAGNOSIS — L309 Dermatitis, unspecified: Secondary | ICD-10-CM

## 2022-05-27 MED ORDER — CLOBETASOL PROPIONATE 0.05 % EX SOLN: 1.0000 "application " | Freq: Two times a day (BID) | CUTANEOUS | 2 refills | Status: AC

## 2022-05-27 NOTE — Telephone Encounter (Signed)
Ok to refill per Dr Denna Haggard scalp Clobetasol

## 2022-06-01 ENCOUNTER — Other Ambulatory Visit: Payer: Self-pay

## 2022-06-01 ENCOUNTER — Telehealth: Payer: Self-pay | Admitting: Cardiovascular Disease

## 2022-06-01 NOTE — Telephone Encounter (Signed)
Needs refill on  Inspra. Explained he has 1 refill. Patient stated he cannot afford this medication and would like to try less expensive. BP while on phone 154/75, P 47. Education on taking blood pressure procedure. Will call patient back in 10 minutes to check. In ten minutes BP 139/72, P 54.

## 2022-06-01 NOTE — Telephone Encounter (Signed)
Patient can not be switched to spironolactone due to gynecomastia.  Checked Goodrx and eplerenone is affordable using Good rx.  Range: $2.60 for 30 days at [facility], 9.03 at [place], $12.41 at [facility], 33.65 at [facility]

## 2022-06-01 NOTE — Telephone Encounter (Signed)
Pt c/o medication issue:  1. Name of Medication: Eplerenone (Inspra) 25 mg daily   2. How are you currently taking this medication (dosage and times per day)? 1 tablet daily  3. Are you having a reaction (difficulty breathing--STAT)? No   4. What is your medication issue? Patient only has a couple days worth of this medication left and is wanting to know if he should continue taking it or not. If so he will need a refill.

## 2022-06-01 NOTE — Telephone Encounter (Signed)
Patient informed to check Goodrx. Eplerenone is affordable using Good rx.  Range: $2.60 for 30 days at [facility], 9.03 at [place], $12.41 at [facility], 33.65 at [facility]. Patient said he will check with [facility].

## 2022-06-03 ENCOUNTER — Other Ambulatory Visit: Payer: Self-pay

## 2022-06-03 MED ORDER — EPLERENONE 25 MG PO TABS: 25.0000 mg | ORAL_TABLET | Freq: Every day | ORAL | 1 refills | Status: DC

## 2022-06-03 NOTE — Telephone Encounter (Signed)
Patient currently on 25 mg of Eplerenone, needed refill- I did go ahead and refill the 25 mg that was currently on list. He was wanting a cheaper alternative, but with good RX he is able to get this.  Thanks!

## 2022-06-10 ENCOUNTER — Telehealth: Payer: Self-pay | Admitting: Cardiovascular Disease

## 2022-06-10 NOTE — Telephone Encounter (Signed)
*  STAT* If patient is at the pharmacy, call can be transferred to refill team.   1. Which medications need to be refilled? (please list name of each medication and dose if known) need a new prescription, switching pharmacy -Eplerenone  2. Which pharmacy/location (including street and city if local pharmacy) is medication to be sent to?[facility], [place]  3. Do they need a 30 day or 90 day supply? 90 days and refills-please call today, been out for a week+

## 2022-06-11 MED ORDER — EPLERENONE 25 MG PO TABS: 25.0000 mg | ORAL_TABLET | Freq: Every day | ORAL | 1 refills | Status: DC

## 2022-08-26 IMAGING — MR MR ABDOMEN WO/W CM
11 of 17 series · 30 of 48 positions shown · IV contrast (20ml Multihance)
Comparison: CT 04/04/2021, MRI 06 28 2017

CLINICAL DATA: New LEFT renal mass. New RIGHT renal lesion. Stable
LEFT renal mass.

EXAM:
MRI ABDOMEN WITHOUT AND WITH CONTRAST
TECHNIQUE: Multiplanar multisequence MR imaging of the abdomen was performed
both before and after the administration of intravenous contrast.
CONTRAST:  20mL MULTIHANCE GADOBENATE DIMEGLUMINE 529 MG/ML IV SOLN

[Series 3: T2 · coronal · 5.0mm · 1.64mm/px · 2 of 41 slices shown (1 of 3)]
[im 1/41]
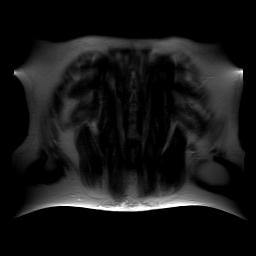
[im 41/41]
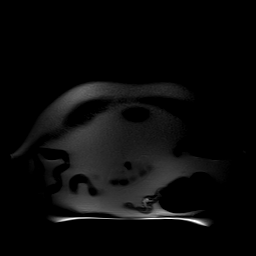

[Series 4: axial tru fisp · axial · 5.0mm · 1.64mm/px · z∈[-139,+90]mm · 2 of 41 slices shown]
[im 1/41]
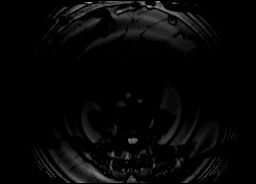
[im 41/41]
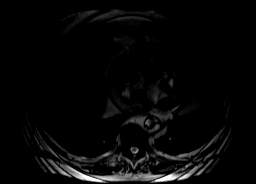

[Series 5: T2 · axial · 6.5mm · 0.82mm/px · z∈[-149,+155]mm · 2 of 40 slices shown (2 of 3)]
[im 1/40]
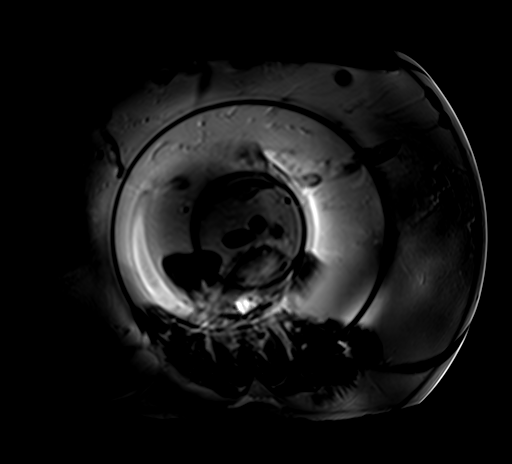
[im 40/40]
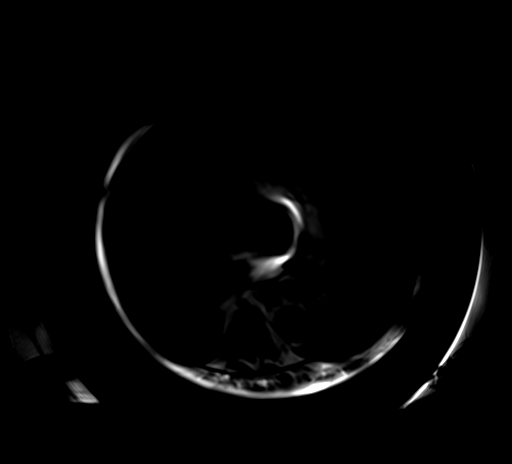

[Series 6: ep2d_diff_b50_500_800_p2 · axial · 6.0mm · 2.19mm/px · z∈[-167,+145]mm · 5 of 121 slices shown]
[im 1/121]
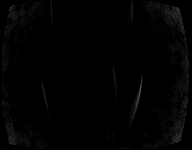
[im 31/121]
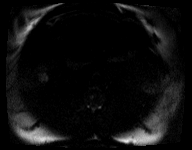
[im 61/121]
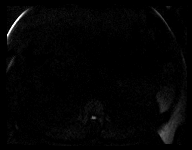
[im 91/121]
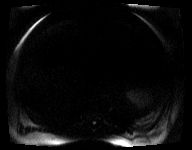
[im 121/121]
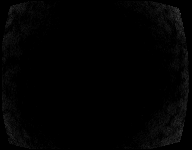

[Series 7: ep2d_[id number]_500_800_p2_adc · axial · 6.0mm · 2.19mm/px · z∈[-167,+145]mm · 2 of 40 slices shown]
[im 1/40]
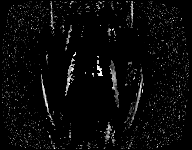
[im 40/40]
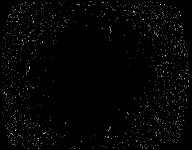

[Series 8: T2 · axial · 5.0mm · 1.64mm/px · z∈[-131,+134]mm · 2 of 42 slices shown (3 of 3)]
[im 1/42]
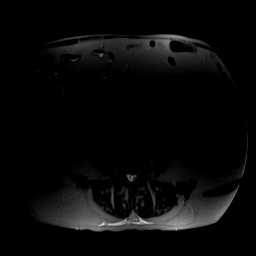
[im 42/42]
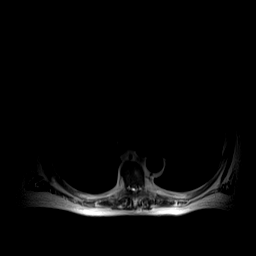

[Series 9: axial in out · axial · 6.0mm · 0.82mm/px · z∈[-125,+129]mm · 3 of 76 slices shown]
[im 1/76]
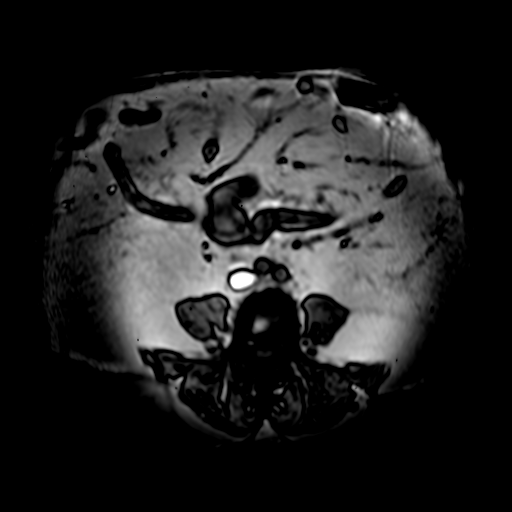
[im 38/76]
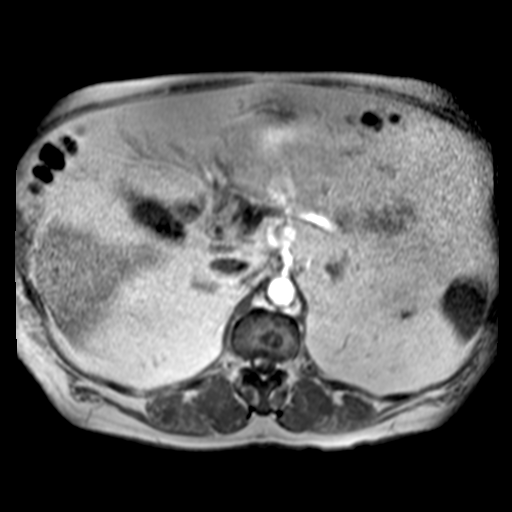
[im 76/76]
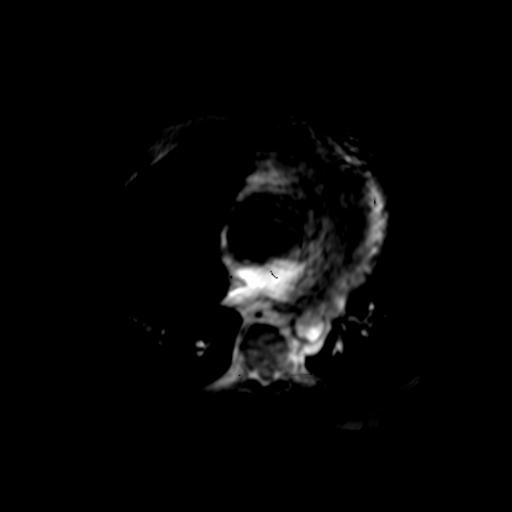

[Series 10: T1 dynamic · axial · non-contrast · 2.2mm · 0.82mm/px · z∈[-146,+63]mm · 3 of 96 slices shown]
[im 1/96]
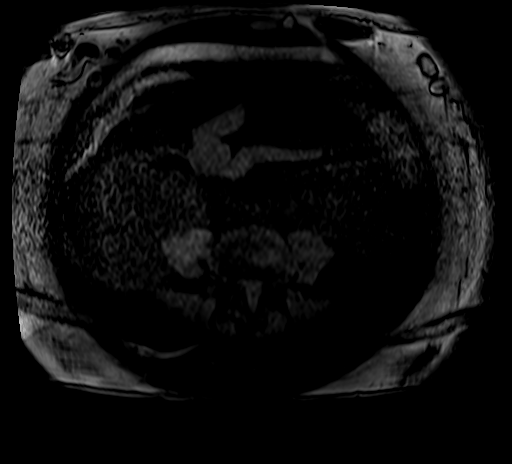
[im 48/96]
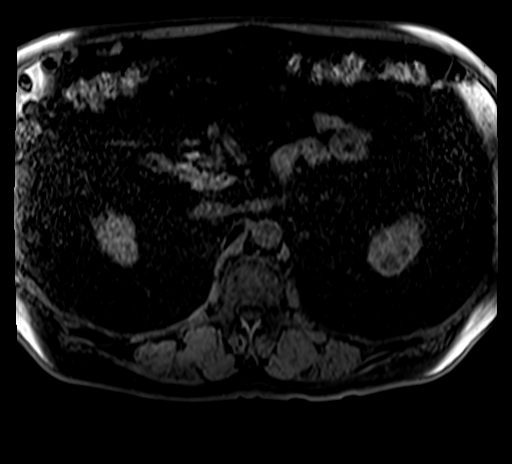
[im 96/96]
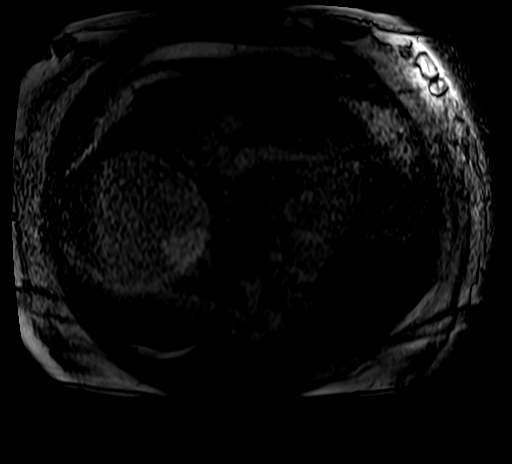

[Series 11: post 25 sec · axial · 2.2mm · 0.82mm/px · z∈[-146,+63]mm · 3 of 96 slices shown]
[im 1/96]
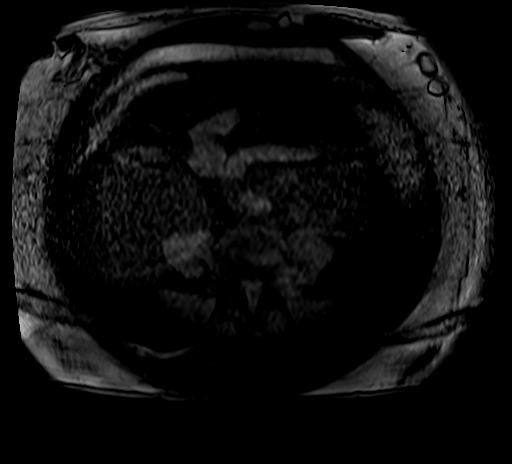
[im 48/96]
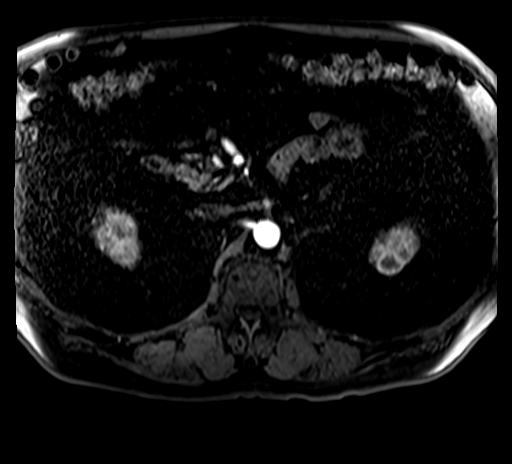
[im 96/96]
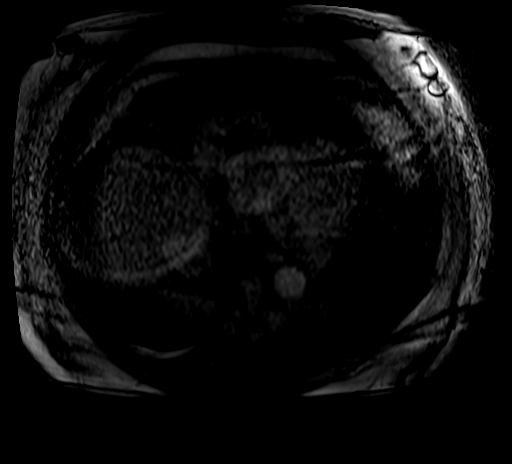

[Series 12: post 25 sec_sub · axial · 2.2mm · 0.82mm/px · z∈[-146,+63]mm · 3 of 96 slices shown]
[im 1/96]
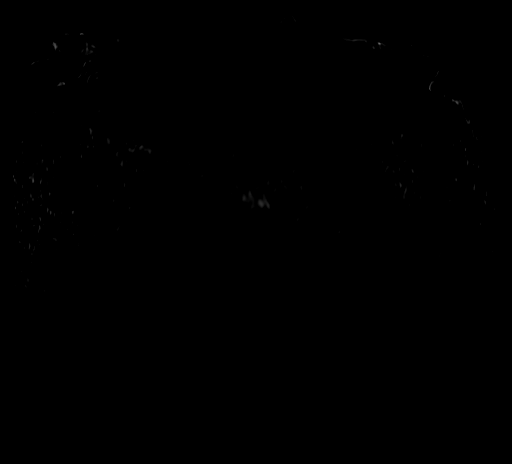
[im 48/96]
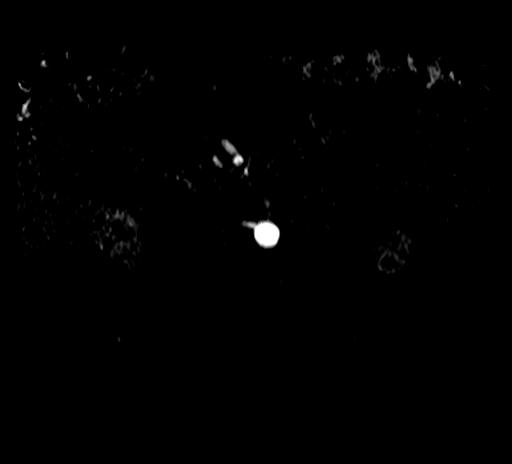
[im 96/96]
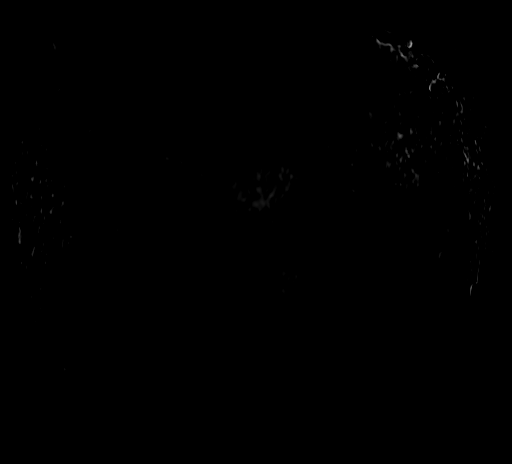

[Series 13: post 45 sec · axial · 2.2mm · 0.82mm/px · z∈[-146,+63]mm · 3 of 96 slices shown]
[im 1/96]
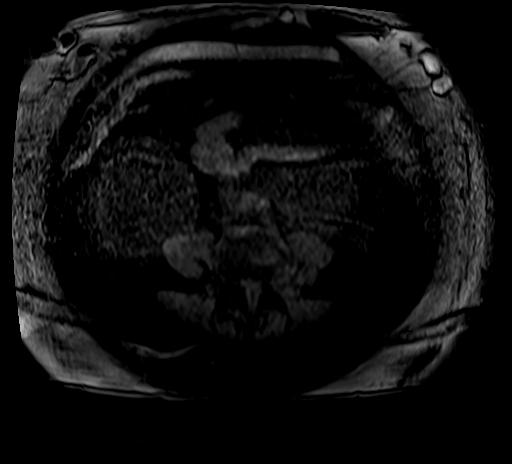
[im 48/96]
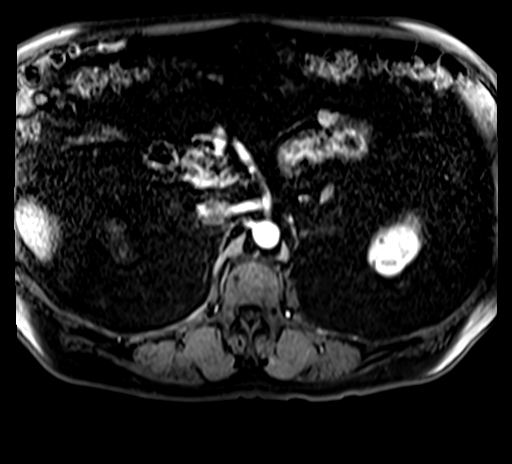
[im 96/96]
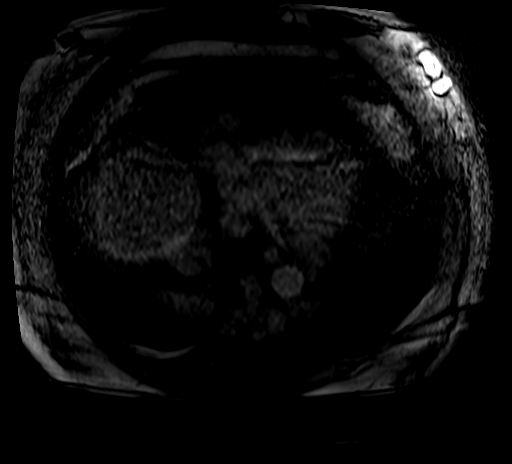

[30 of 48 positions shown; findings below may reference images not displayed]

FINDINGS: Lower chest:  Lung bases are clear.

Hepatobiliary: Several gallstones. No gallbladder inflammation. No
duct dilatation. Normal liver.

Pancreas: Normal pancreatic parenchymal intensity. No ductal
dilatation or inflammation.

Spleen: Normal spleen.

Adrenals/urinary tract: Adrenal glands normal.

Within the anterior cortex LEFT mid kidney, again demonstrated a
bilobed enhancing mass measuring 2.9 x 2.6 cm (image 82/series 15)
which compares to 3.0 x 2.5 cm on CT 04/04/2021. Lesion new from MRI
6077.

In the medial cortex of the LEFT kidney again demonstrated round
delayed enhancing lesion measuring 1.7 cm (image 65/series 16) which
compares to 1.6 cm on CT. Lesion has low signal intensity on T2
weighted imaging and restricted diffusion

Typical nonenhancing Bosniak 1 cyst the posterior cortex of the LEFT
kidney (image 67/15)

Lesion of concern in the upper pole of the RIGHT kidney has high
signal intensity on T2 weighted imaging measuring 13 mm (image
26/series 8) and has questionable nodular internal enhancement
(image 55/series 15).

Stomach/Bowel: Stomach and limited of the small bowel is
unremarkable

Vascular/Lymphatic: Abdominal aortic normal caliber. No
retroperitoneal periportal lymphadenopathy.

Musculoskeletal: No aggressive osseous lesion
IMPRESSION: 1. Bilobed enhancing mass in the anterior cortex of LEFT mid kidney
stable in size from CT 04/04/2021 but new from MRI 06/28/2017.
Findings concerning for renal cell carcinoma.
2. Stable enhancing lesion medial cortex of LEFT kidney most
consistent with papillary renal cell carcinoma.
3. Cystic lesion in the upper pole RIGHT kidney with central nodular
enhancement. Difficult to characterize small cortical lesion.
(Bosniak 2F: Recommend attention on surveillance).

## 2022-09-07 ENCOUNTER — Telehealth: Payer: Self-pay | Admitting: Cardiovascular Disease

## 2022-09-07 MED ORDER — EPLERENONE 25 MG PO TABS: 25.0000 mg | ORAL_TABLET | Freq: Every day | ORAL | 3 refills | Status: DC

## 2022-09-07 NOTE — Telephone Encounter (Signed)
*  STAT* If patient is at the pharmacy, call can be transferred to refill team.   1. Which medications need to be refilled? (please list name of each medication and dose if known)   eplerenone (INSPRA) 25 MG tablet    2. Which pharmacy/location (including street and city if local pharmacy) is medication to be sent to?  [place] ([place] - [address] Phone:  [phone]         3. Do they need a 30 day or 90 day supply?  90 day   Pt has scheduled appt on 10/09/22. Please advise

## 2022-09-17 DIAGNOSIS — N1832 Chronic kidney disease, stage 3b: Secondary | ICD-10-CM | POA: Diagnosis not present

## 2022-09-17 DIAGNOSIS — I129 Hypertensive chronic kidney disease with stage 1 through stage 4 chronic kidney disease, or unspecified chronic kidney disease: Secondary | ICD-10-CM | POA: Diagnosis not present

## 2022-09-17 DIAGNOSIS — R0981 Nasal congestion: Secondary | ICD-10-CM | POA: Diagnosis not present

## 2022-10-08 NOTE — Progress Notes (Unsigned)
Cardiology Clinic Note   Patient Name: Christopher Hensley Date of Encounter: 10/10/2022  Primary Care Provider:  Lajean Manes, MD Primary Cardiologist:  Evalina Field, MD  Patient Profile    Christopher Hensley 88 year old male presents to the clinic today for 6 month follow-up  Past Medical History    Past Medical History:  Diagnosis Date   Anemia    Angina    chest pain "anxiety related"   Anxiety    lost wife 01/2011   Arthritis    Blood transfusion    "last time 09/2011"   Cancer [facility])    prostate   Cancer of prostate ([place])    "had 58 radiation txs; last tx ~ 2004"   Cataract immature    "both eyes"   Chronic kidney disease 10/20/11   "go to dr twice a year to check my kidneys; don't know what for"   Depression 09/19/11    "always depressed"   Diabetes mellitus    "take pills"   Heart murmur    "as a child"   Hypertension    Neuromuscular disorder ([place])    "peripheral neuropathy"   Pneumonia    "probably as a child"   Rheumatic fever    "when I was a kid"   Sleep apnea 10/20/11   "have a machine; don't use it"   Past Surgical History:  Procedure Laterality Date   CARDIAC CATHETERIZATION     denies h/o stents   COLONOSCOPY WITH PROPOFOL N/A 06/14/2014   Procedure: COLONOSCOPY WITH PROPOFOL;  Surgeon: Juanita Craver, MD;  Location: [facility] ENDOSCOPY;  Service: Endoscopy;  Laterality: N/A;   JOINT REPLACEMENT  09/28/11   right knee replacement   JOINT REPLACEMENT  ~ 1990's   left knee replacement   PROSTATE CRYOABLATION     "dissolved it"   TONSILLECTOMY     "I think they took them out when I was a kid"    Allergies  Allergies  Allergen Reactions   Oxycodone Other (See Comments)    agitation    History of Present Illness    Christopher Hensley has a PMH of obesity, HLD, DM, HTN, lower extremity edema, and shortness of breath.  Echocardiogram 12/17/2021 showed LVEF of 60 to 65%, G1 DD, no significant valvular abnormalities.  His stress testing was also  normal.  He was seen in follow-up by Dr. Audie Box on 03/05/2022.  During that time he reported continuing shortness of breath.  His spironolactone had been stopped due to concerns for gynecomastia by his PCP.  He remained fluid volume up.  His weight was increasing.  He was on furosemide 40 mg twice daily.  He had been started on medication by his PCP the day prior to his follow-up visit.  Starting eplerenone was discussed.  It was explained that the medication was similar to Aldactone but did not have the side effects of gynecomastia.  He agreed to proceed.  His blood pressure was well controlled.  Stress testing showed no concern for coronary artery disease.  He had multiple cardiac catheterizations previously which showed normal coronary anatomy.  He reported that he had been eating at restaurants and fast food for all of his meals.  He enjoyed eating places such as [employer] and other restaurants.  He was noncompliant with lower extremity compression stockings.  It was felt that his dietary indiscretion was contributing.  He presents to the clinic today for follow-up evaluation with his wife and states he is  doing fine. He denies any shortness of breath, although his wife disagrees. Says breathing is the same as he was last seen 6 months ago. Today he denies denies chest pain, shortness of breath, lower extremity edema, fatigue, palpitations, melena, hematuria, hemoptysis, diaphoresis, weakness, presyncope, syncope, orthopnea, and PND. Wife said previously he had a bad cough, and patient said he took some medicine and this was relieved. He is requesting to stop Inspra today due to cost. Weight is down from 6 months ago. Confirms with me he is not taking Spironolactone as this was stopped by his PCP.  Denies any other questions or concerns.   Home Medications    Prior to Admission medications   Medication Sig Start Date End Date Taking? Authorizing Provider  allopurinol (ZYLOPRIM) 100 MG tablet Take  100 mg by mouth 2 (two) times daily.     [provider]  amLODipine (NORVASC) 10 MG tablet Take 1 tablet by mouth daily. Patient not taking: Reported on 03/05/2022    [provider]  amLODipine (NORVASC) 5 MG tablet Take 5 mg by mouth every morning.    [provider]  Ascorbic Acid (VITAMIN C) 1000 MG tablet Take 1,000 mg by mouth daily. Patient not taking: Reported on 03/05/2022    [provider]  aspirin 81 MG tablet Take 81 mg by mouth daily.    [provider]  atenolol (TENORMIN) 25 MG tablet Take 25 mg by mouth daily. 02/10/22   [provider]  atorvastatin (LIPITOR) 10 MG tablet Take 10 mg by mouth daily. Patient not taking: Reported on 03/05/2022    [provider]  benzonatate (TESSALON) 100 MG capsule Take by mouth. Patient not taking: Reported on 03/05/2022 08/19/21   [provider]  busPIRone (BUSPAR) 5 MG tablet Take 2 tablets by mouth daily. 12/13/15   [provider]  clobetasol (TEMOVATE) 0.05 % external solution Apply 1 application  topically 2 (two) times daily. 05/27/22   Lavonna Monarch, MD  Clobetasol Prop Emollient Base (CLOBETASOL PROPIONATE E) 0.05 % emollient cream Apply 1 application topically 2 (two) times daily. Patient not taking: Reported on 03/05/2022 11/04/20   Lavonna Monarch, MD  eplerenone (INSPRA) 25 MG tablet Take 1 tablet (25 mg total) by mouth daily. 09/07/22   O'NealCassie Freer, MD  furosemide (LASIX) 20 MG tablet Take 20 mg by mouth 2 (two) times daily. Take 2 tablets twice daily    [provider]  HYDROcodone bit-homatropine (HYCODAN) 5-1.5 MG/5ML syrup Take 5 mLs by mouth every 6 (six) hours as needed for cough. Patient not taking: Reported on 03/05/2022    [provider]  lisinopril (PRINIVIL,ZESTRIL) 20 MG tablet Take 20 mg by mouth every morning. Patient not taking: Reported on 03/05/2022    [provider]  Multiple Vitamin (MULTIVITAMIN WITH  MINERALS) TABS tablet Take 1 tablet by mouth daily.    [provider]  nitroGLYCERIN (NITROSTAT) 0.4 MG SL tablet 1 tablet as needed for chest pain. If still with chest pain, take additional tablet in 15 mins. Repeat for 3 doses and report to ED if still with chest pain Patient not taking: Reported on 03/05/2022 11/03/21   [provider]  olmesartan (BENICAR) 40 MG tablet Take 1 tablet by mouth daily.    [provider]  Omega-3 Fatty Acids (FISH OIL) 1000 MG CAPS 1 capsule    [provider]  rosuvastatin (CRESTOR) 5 MG tablet Take 5 mg by mouth daily. 07/28/21   [provider]    Family History    Family History  Problem Relation Age of Onset   Heart disease Father    He indicated that his mother is deceased. He indicated that his father is deceased.  Social History    Social History   Socioeconomic History   Marital status: Married    Spouse name: Not on file   Number of children: Not on file   Years of education: Not on file   Highest education level: Not on file  Occupational History   Occupation: Retired Artist  Tobacco Use   Smoking status: Never   Smokeless tobacco: Former    Types: Chew   Tobacco comments:    10/20/11 "for 70 years; not regular; carton lasts me > 1 month"  Substance and Sexual Activity   Alcohol use: No   Drug use: No   Sexual activity: Not on file  Other Topics Concern   Not on file  Social History Narrative   Not on file   Social Determinants of Health   Financial Resource Strain: Not on file  Food Insecurity: Not on file  Transportation Needs: Not on file  Physical Activity: Not on file  Stress: Not on file  Social Connections: Not on file  Intimate Partner Violence: Not on file     Review of Systems    General:  No chills, fever, night sweats or weight changes.  Cardiovascular:  Minimal, trace edema along BLE, chronic. No chest pain, orthopnea, palpitations, paroxysmal nocturnal  dyspnea. Dermatological: No rash, lesions/masses Respiratory: No cough, dyspnea Urologic: No hematuria, dysuria Abdominal:   No nausea, vomiting, diarrhea, bright red blood per rectum, melena, or hematemesis Neurologic:  No visual changes, wkns, changes in mental status. All other systems reviewed and are otherwise negative except as noted above.  Physical Exam    VS:  BP (!) 116/58 (BP Location: Left Arm, Patient Position: Sitting, Cuff Size: Large)   Pulse (!) 56   Ht 6' (1.829 m)   Wt 268 lb 12.8 oz (121.9 kg)   SpO2 92%   BMI 36.46 kg/m  , BMI Body mass index is 36.46 kg/m. GEN: Obese, 88 y.o. Caucasian male in no acute distress. HEENT: normal. Neck: Supple, no JVD, carotid bruits, thyroid nodule noted along right border. Cardiac: S1/S2, RRR, no murmurs, rubs, or gallops. No clubbing, cyanosis. 1+ pitting edema along BLE, Radials/DP/PT 2+ and equal bilaterally.  Respiratory:  Respirations regular and unlabored, clear and diminished to auscultation bilaterally. MS: no deformity or atrophy. Skin: warm and dry, no rash. Neuro:  Strength and sensation are intact. Psych: Normal affect.  Accessory Clinical Findings    Recent Labs: 11/20/2021: BNP 65.1; BUN 34; Creatinine, Ser 1.46; Potassium 4.7; Sodium 141   Recent Lipid Panel    Component Value Date/Time   CHOL  11/25/2009 0455    147        ATP III CLASSIFICATION:  <200     mg/dL   Desirable  200-239  mg/dL   Borderline High  >=240    mg/dL   High          TRIG 173 (H) 11/25/2009 0455   HDL 29 (L) 11/25/2009 0455   CHOLHDL 5.1 11/25/2009 0455   VLDL 35 11/25/2009 0455   LDLCALC  11/25/2009 0455    83        Total Cholesterol/HDL:CHD Risk Coronary Heart Disease Risk Table  Men   Women  1/2 Average Risk   3.4   3.3  Average Risk       5.0   4.4  2 X Average Risk   9.6   7.1  3 X Average Risk  23.4   11.0        Use the calculated Patient Ratio above and the CHD Risk Table to determine the  patient's CHD Risk.        ATP III CLASSIFICATION (LDL):  <100     mg/dL   Optimal  100-129  mg/dL   Near or Above                    Optimal  130-159  mg/dL   Borderline  160-189  mg/dL   High  >190     mg/dL   Very High         ECG personally reviewed by me today- Sinus bradycardia 56 bpm, otherwise no acute changes  Echocardiogram 12/17/2021  IMPRESSIONS     1. Left ventricular ejection fraction, by estimation, is 60 to 65%. The  left ventricle has normal function. The left ventricle has no regional  wall motion abnormalities. There is mild left ventricular hypertrophy.  Left ventricular diastolic parameters  are consistent with Grade I diastolic dysfunction (impaired relaxation).   2. Right ventricular systolic function is normal. The right ventricular  size is normal.   3. The mitral valve is normal in structure. No evidence of mitral valve  regurgitation. No evidence of mitral stenosis.   4. The aortic valve is tricuspid. There is mild calcification of the  aortic valve. There is mild thickening of the aortic valve. Aortic valve  regurgitation is not visualized. Aortic valve sclerosis is present, with  no evidence of aortic valve stenosis.   5. The inferior vena cava is normal in size with greater than 50%  respiratory variability, suggesting right atrial pressure of 3 mmHg.  Nuclear stress test 12/02/2021    The study is normal. The study is low risk.   No ST deviation was noted.   LV perfusion is normal. There is no evidence of ischemia. There is no evidence of infarction.   Left ventricular function is normal. Nuclear stress EF: 63 %. The left ventricular ejection fraction is normal (55-65%). End diastolic cavity size is normal. End systolic cavity size is normal.   Prior study not available for comparison.  Assessment & Plan   1.  Shortness of breath-stable.  Noted to have normal echocardiogram 12/17/2021 and normal stress testing.  Patient denies any recent  shortness of breath, although wife thinks otherwise. Patient is requesting to stop Eplerenone today due to cost. Weight is down 14 lbs from March 2023. Overall euvolemic and well compensated on exam. Multiple LHC with normal coronary anatomy. Will stop eplerenone and continue Lasix. Heart healthy diet and regular cardiovascular exercise as tolerated encouraged. Wt log given to patient and discussed to let us now if symptoms worsen and can consider increasing Lasix if needed.   2. Essential hypertension-BP today is 116/58 Continue amlodipine, pyrazinamide, lisinopril. Stop Eplerenone and continue Lasix. Stop Atenolol and switch to Metoprolol succinate 25 mg daily. Heart healthy low-sodium diet-salty 6 given. Increase physical activity as tolerated  3. Bradycardia HR today is 56. Asymptomatic with this and denies any concerns. Will d/c atenolol and start metoprolol succinate 25 mg daily. I have provided a GoodRx coupon for him to help with cost.   3. Hyperlipidemia-LDL 39  on 03/25/22 Continue rosuvastatin Heart healthy low-sodium high-fiber diet Increase physical activity as tolerated  4. Type 2 diabetes-glucose 112 on 11/20/2021.  A1c 6.7 Carb modified diet Continue weight loss Increase physical activity as tolerated Follows with PCP  5. Class 2 Obesity-weight today 268 lbs.  Encouraged increased physical activity and calorie restricted diet. Continue weight loss  6. Disposition: Follow-up with Dr. Audie Box or APP in 3-4 months.   Finis Bud, NP-C     10/10/2022, 12:22 PM [address] Office [phone] Fax [phone]  Notice: This dictation was prepared with Dragon dictation along with smaller phrase technology. Any transcriptional errors that result from this process are unintentional and may not be corrected upon review.  I spent 25 minutes examining this patient, reviewing medications, and using patient centered shared  decision making involving her cardiac care.  Prior to her visit I spent greater than 20 minutes reviewing her past medical history,  medications, and prior cardiac tests.

## 2022-10-09 ENCOUNTER — Encounter: Payer: Self-pay | Admitting: General Practice

## 2022-10-09 ENCOUNTER — Ambulatory Visit: Payer: Medicare Other | Attending: General Practice | Admitting: Nurse Practitioner

## 2022-10-09 VITALS — BP 116/58 | HR 56 | Ht 72.0 in | Wt 268.8 lb

## 2022-10-09 DIAGNOSIS — I10 Essential (primary) hypertension: Secondary | ICD-10-CM

## 2022-10-09 DIAGNOSIS — R0602 Shortness of breath: Secondary | ICD-10-CM

## 2022-10-09 DIAGNOSIS — E0800 Diabetes mellitus due to underlying condition with hyperosmolarity without nonketotic hyperglycemic-hyperosmolar coma (NKHHC): Secondary | ICD-10-CM

## 2022-10-09 DIAGNOSIS — E782 Mixed hyperlipidemia: Secondary | ICD-10-CM | POA: Diagnosis not present

## 2022-10-09 DIAGNOSIS — R001 Bradycardia, unspecified: Secondary | ICD-10-CM

## 2022-10-09 DIAGNOSIS — Z6836 Body mass index (BMI) 36.0-36.9, adult: Secondary | ICD-10-CM

## 2022-10-09 DIAGNOSIS — E6609 Other obesity due to excess calories: Secondary | ICD-10-CM

## 2022-10-09 MED ORDER — METOPROLOL SUCCINATE ER 25 MG PO TB24: 25.0000 mg | ORAL_TABLET | Freq: Every day | ORAL | 6 refills | Status: DC

## 2022-10-09 NOTE — Patient Instructions (Signed)
Medication Instructions:  STOP ATENOLOL  START METOPROLOL SUCC '25MG'$  DAILY  STOP EPLERENONE (INSPIRA) *If you need a refill on your cardiac medications before your next appointment, please call your pharmacy*  Lab Work: NONE  Other Instructions TAKE AND LOG YOUR BLOOD PRESSURE  TAKE AND LOG YOUR WEIGHT  PLEASE PURCHASE AND WEAR COMPRESSION STOCKINGS DAILY AND TAKE OFF AT BEDTIME. Compression stockings are elastic socks that squeeze the legs. They help to increase blood flow to the legs and to decrease swelling in the legs from fluid retention, and reduce the chance of developing blood clots in the lower legs. Please put on in the AM when dressing and off at night when dressing for bed.  LET THEM KNOW THAT YOU NEED KNEE HIGH'S WITH COMPRESSION OF 15-20 mmhg.  [employer]  [place] ([place] [phone]); [place], [place] [address]; [phone]  EMAIL   [email or web address].  PLEASE MAKE SURE TO ELEVATE YOUR FEET & LEGS WHILE SITTING, THIS WILL HELP WITH THE SWELLING ALSO.   Follow-Up: At [facility], you and your health needs are our priority.  As part of our continuing mission to provide you with exceptional heart care, we have created designated Provider Care Teams.  These Care Teams include your primary Cardiologist (physician) and Advanced Practice Providers (APPs -  Physician Assistants and Nurse Practitioners) who all work together to provide you with the care you need, when you need it.  We recommend signing up for the patient portal called "MyChart".  Sign up information is provided on this After Visit Summary.  MyChart is used to connect with patients for Virtual Visits (Telemedicine).  Patients are able to view lab/test results, encounter notes, upcoming appointments, etc.  Non-urgent messages can be sent to your provider as well.   To learn more about what you can do with MyChart, go to [email or web address].    Your next appointment:   3-4  month(s)  The format for your next appointment:   In Person  Provider:   Evalina Field, MD     Important Information About Sugar

## 2022-10-19 ENCOUNTER — Telehealth: Payer: Self-pay | Admitting: Cardiovascular Disease

## 2022-10-19 NOTE — Telephone Encounter (Signed)
Pt c/o medication issue:  1. Name of Medication: metoprolol succinate (TOPROL XL) 25 MG 24 hr tablet   2. How are you currently taking this medication (dosage and times per day)? As directed//has not taken this morning.   3. Are you having a reaction (difficulty breathing--STAT)? Yes  4. What is your medication issue? Pt states that this new medication is causing burning sensation while urinating. Needing call back to discuss this.

## 2022-10-19 NOTE — Telephone Encounter (Signed)
Patient reports dysuria since taking metoprolol succinate (10/27). He denies drainage form penis, fever, or chills. When I asked him the color of his urine, he was inconsistent in answering (brownish to "I don't know"). He will see PCP on Thursday. He stated he has been taking Omega XL and VitD3 for muscle and joint health. He stated his not taking Omega-3 fatty acid any more.   Suggestions for met succ.

## 2022-10-20 NOTE — Telephone Encounter (Signed)
Recommend patient go to urgent care or see PCP

## 2022-10-20 NOTE — Telephone Encounter (Signed)
Patient stated he is feeling better today and will keep appointment with PCP for Thursday.

## 2022-10-22 ENCOUNTER — Other Ambulatory Visit: Payer: Self-pay | Admitting: Internal Medicine

## 2022-10-22 ENCOUNTER — Ambulatory Visit
Admission: RE | Admit: 2022-10-22 | Discharge: 2022-10-22 | Disposition: A | Discharge status: Home / Self Care | Payer: Medicare Other | Source: Ambulatory Visit | Attending: Internal Medicine | Admitting: Internal Medicine

## 2022-10-22 DIAGNOSIS — I129 Hypertensive chronic kidney disease with stage 1 through stage 4 chronic kidney disease, or unspecified chronic kidney disease: Secondary | ICD-10-CM | POA: Diagnosis not present

## 2022-10-22 DIAGNOSIS — R051 Acute cough: Secondary | ICD-10-CM

## 2022-10-22 DIAGNOSIS — R300 Dysuria: Secondary | ICD-10-CM | POA: Diagnosis not present

## 2022-10-22 DIAGNOSIS — R059 Cough, unspecified: Secondary | ICD-10-CM | POA: Diagnosis not present

## 2022-10-22 DIAGNOSIS — E1122 Type 2 diabetes mellitus with diabetic chronic kidney disease: Secondary | ICD-10-CM | POA: Diagnosis not present

## 2022-11-16 DIAGNOSIS — N1832 Chronic kidney disease, stage 3b: Secondary | ICD-10-CM | POA: Diagnosis not present

## 2022-11-18 ENCOUNTER — Other Ambulatory Visit: Payer: Self-pay | Admitting: Urology

## 2022-11-18 DIAGNOSIS — D49512 Neoplasm of unspecified behavior of left kidney: Secondary | ICD-10-CM

## 2022-11-18 DIAGNOSIS — N281 Cyst of kidney, acquired: Secondary | ICD-10-CM

## 2022-11-23 DIAGNOSIS — N2889 Other specified disorders of kidney and ureter: Secondary | ICD-10-CM | POA: Diagnosis not present

## 2022-11-23 DIAGNOSIS — R7303 Prediabetes: Secondary | ICD-10-CM | POA: Diagnosis not present

## 2022-11-23 DIAGNOSIS — I129 Hypertensive chronic kidney disease with stage 1 through stage 4 chronic kidney disease, or unspecified chronic kidney disease: Secondary | ICD-10-CM | POA: Diagnosis not present

## 2022-11-23 DIAGNOSIS — N1831 Chronic kidney disease, stage 3a: Secondary | ICD-10-CM | POA: Diagnosis not present

## 2022-12-11 DIAGNOSIS — H02834 Dermatochalasis of left upper eyelid: Secondary | ICD-10-CM | POA: Diagnosis not present

## 2022-12-11 DIAGNOSIS — H52223 Regular astigmatism, bilateral: Secondary | ICD-10-CM | POA: Diagnosis not present

## 2022-12-11 DIAGNOSIS — H02831 Dermatochalasis of right upper eyelid: Secondary | ICD-10-CM | POA: Diagnosis not present

## 2022-12-11 DIAGNOSIS — E119 Type 2 diabetes mellitus without complications: Secondary | ICD-10-CM | POA: Diagnosis not present

## 2022-12-11 DIAGNOSIS — H524 Presbyopia: Secondary | ICD-10-CM | POA: Diagnosis not present

## 2022-12-11 DIAGNOSIS — H353132 Nonexudative age-related macular degeneration, bilateral, intermediate dry stage: Secondary | ICD-10-CM | POA: Diagnosis not present

## 2022-12-18 ENCOUNTER — Ambulatory Visit
Admission: RE | Admit: 2022-12-18 | Discharge: 2022-12-18 | Disposition: A | Discharge status: Home / Self Care | Payer: Medicare Other | Source: Ambulatory Visit | Attending: Urology | Admitting: Urology

## 2022-12-18 DIAGNOSIS — D49512 Neoplasm of unspecified behavior of left kidney: Secondary | ICD-10-CM

## 2022-12-18 DIAGNOSIS — N281 Cyst of kidney, acquired: Secondary | ICD-10-CM

## 2022-12-18 MED ORDER — GADOPICLENOL 0.5 MMOL/ML IV SOLN
10.0000 mL | Freq: Once | INTRAVENOUS | Status: AC | PRN
  Administered 2022-12-18: 10 mL via INTRAVENOUS

## 2023-02-02 NOTE — Progress Notes (Signed)
Cardiology Office Note:   Date:  02/05/2023  NAME:  Christopher Hensley    MRN: [id number] DOB:  13-Jun-1932   PCP:  Lajean Manes, MD  Cardiologist:  Evalina Field, MD  Electrophysiologist:  None   Referring MD: Lajean Manes, MD   Chief Complaint  Patient presents with   Follow-up   History of Present Illness:   Christopher Hensley is a 89 y.o. male with a hx of DM, obesity, HTN, HLD who presents for follow-up. Normal echo and stress test last year.  He reports he is doing well.  Denies any chest pain or trouble breathing.  Working on losing weight.  Blood pressure 150/66 in office.  Most recent A1c 6.7.  LDL cholesterol was at goal last year.  Weights are stable.  Denies any significant lower extremity edema.  It is controlled with Lasix.  Blood pressure log shows values range between 130-140 at home.  Given his age I do not believe we need to treat this aggressively.  Overall doing well.  Problem List DM -A1c 6.7 2. HLD -T chol 90, HDL 32, LDL 35, TG 129 3. HTN 4. CKD 5. Obesity  -BMI 37  Past Medical History: Past Medical History:  Diagnosis Date   Anemia    Angina    chest pain "anxiety related"   Anxiety    lost wife 01/2011   Arthritis    Blood transfusion    "last time 09/2011"   Cancer [facility])    prostate   Cancer of prostate ([place])    "had 66 radiation txs; last tx ~ 2004"   Cataract immature    "both eyes"   Chronic kidney disease 10/20/11   "go to dr twice a year to check my kidneys; don't know what for"   Depression 09/19/11    "always depressed"   Diabetes mellitus    "take pills"   Heart murmur    "as a child"   Hypertension    Neuromuscular disorder ([place])    "peripheral neuropathy"   Pneumonia    "probably as a child"   Rheumatic fever    "when I was a kid"   Sleep apnea 10/20/11   "have a machine; don't use it"    Past Surgical History: Past Surgical History:  Procedure Laterality Date   CARDIAC CATHETERIZATION     denies h/o stents    COLONOSCOPY WITH PROPOFOL N/A 06/14/2014   Procedure: COLONOSCOPY WITH PROPOFOL;  Surgeon: Juanita Craver, MD;  Location: [facility];  Service: Endoscopy;  Laterality: N/A;   JOINT REPLACEMENT  09/28/11   right knee replacement   JOINT REPLACEMENT  ~ 1990's   left knee replacement   PROSTATE CRYOABLATION     "dissolved it"   TONSILLECTOMY     "I think they took them out when I was a kid"    Current Medications: Current Meds  Medication Sig   allopurinol (ZYLOPRIM) 100 MG tablet Take 100 mg by mouth 2 (two) times daily.    amLODipine (NORVASC) 5 MG tablet Take 5 mg by mouth every morning.   aspirin 81 MG tablet Take 81 mg by mouth daily.   busPIRone (BUSPAR) 5 MG tablet Take 2 tablets by mouth daily.   furosemide (LASIX) 20 MG tablet Take 20 mg by mouth 2 (two) times daily. Take 2 tablets twice daily   metoprolol succinate (TOPROL XL) 25 MG 24 hr tablet Take 1 tablet (25 mg total) by mouth daily.   olmesartan (BENICAR) 40  MG tablet Take 1 tablet by mouth daily.   OVER THE COUNTER MEDICATION Take 353 mg by mouth daily. Move free ultra omega joint 2 tabs daily   rosuvastatin (CRESTOR) 5 MG tablet Take 5 mg by mouth daily.   Current Facility-Administered Medications for the 02/05/23 encounter (Office Visit) with Geralynn Rile, MD  Medication   0.9 %  sodium chloride infusion     Allergies:    Oxycodone   Social History: Social History   Socioeconomic History   Marital status: Married    Spouse name: Not on file   Number of children: Not on file   Years of education: Not on file   Highest education level: Not on file  Occupational History   Occupation: Retired Artist  Tobacco Use   Smoking status: Never   Smokeless tobacco: Former    Types: Chew   Tobacco comments:    10/20/11 "for 70 years; not regular; carton lasts me > 1 month"  Substance and Sexual Activity   Alcohol use: No   Drug use: No   Sexual activity: Not on file  Other Topics Concern   Not on  file  Social History Narrative   Not on file   Social Determinants of Health   Financial Resource Strain: Not on file  Food Insecurity: Not on file  Transportation Needs: Not on file  Physical Activity: Not on file  Stress: Not on file  Social Connections: Not on file     Family History: The patient's family history includes Heart disease in his father.  ROS:   All other ROS reviewed and negative. Pertinent positives noted in the HPI.     EKGs/Labs/Other Studies Reviewed:   The following studies were personally reviewed by me today:  TTE 12/02/2021   The study is normal. The study is low risk.   No ST deviation was noted.   LV perfusion is normal. There is no evidence of ischemia. There is no evidence of infarction.   Left ventricular function is normal. Nuclear stress EF: 63 %. The left ventricular ejection fraction is normal (55-65%). End diastolic cavity size is normal. End systolic cavity size is normal.   Prior study not available for comparison.  TTE 12/17/2021  1. Left ventricular ejection fraction, by estimation, is 60 to 65%. The  left ventricle has normal function. The left ventricle has no regional  wall motion abnormalities. There is mild left ventricular hypertrophy.  Left ventricular diastolic parameters  are consistent with Grade I diastolic dysfunction (impaired relaxation).   2. Right ventricular systolic function is normal. The right ventricular  size is normal.   3. The mitral valve is normal in structure. No evidence of mitral valve  regurgitation. No evidence of mitral stenosis.   4. The aortic valve is tricuspid. There is mild calcification of the  aortic valve. There is mild thickening of the aortic valve. Aortic valve  regurgitation is not visualized. Aortic valve sclerosis is present, with  no evidence of aortic valve stenosis.   5. The inferior vena cava is normal in size with greater than 50%  respiratory variability, suggesting right atrial  pressure of 3 mmHg.   Recent Labs: No results found for requested labs within last 365 days.   Recent Lipid Panel    Component Value Date/Time   CHOL  11/25/2009 0455    147        ATP III CLASSIFICATION:  <200     mg/dL   Desirable  200-239  mg/dL   Borderline High  >=240    mg/dL   High          TRIG 173 (H) 11/25/2009 0455   HDL 29 (L) 11/25/2009 0455   CHOLHDL 5.1 11/25/2009 0455   VLDL 35 11/25/2009 0455   LDLCALC  11/25/2009 0455    83        Total Cholesterol/HDL:CHD Risk Coronary Heart Disease Risk Table                     Men   Women  1/2 Average Risk   3.4   3.3  Average Risk       5.0   4.4  2 X Average Risk   9.6   7.1  3 X Average Risk  23.4   11.0        Use the calculated Patient Ratio above and the CHD Risk Table to determine the patient's CHD Risk.        ATP III CLASSIFICATION (LDL):  <100     mg/dL   Optimal  100-129  mg/dL   Near or Above                    Optimal  130-159  mg/dL   Borderline  160-189  mg/dL   High  >190     mg/dL   Very High    Physical Exam:   VS:  BP (!) 150/66   Pulse 67   Ht 6' (1.829 m)   Wt 273 lb (123.8 kg)   SpO2 96%   BMI 37.03 kg/m    Wt Readings from Last 3 Encounters:  02/05/23 273 lb (123.8 kg)  10/09/22 268 lb 12.8 oz (121.9 kg)  03/05/22 282 lb 6.4 oz (128.1 kg)    General: Well nourished, well developed, in no acute distress Head: Atraumatic, normal size  Eyes: PEERLA, EOMI  Neck: Supple, no JVD Endocrine: No thryomegaly Cardiac: Normal S1, S2; RRR; no murmurs, rubs, or gallops Lungs: Clear to auscultation bilaterally, no wheezing, rhonchi or rales  Abd: Soft, nontender, no hepatomegaly  Ext: Trace edema Musculoskeletal: No deformities, BUE and BLE strength normal and equal Skin: Warm and dry, no rashes   Neuro: Alert and oriented to person, place, time, and situation, CNII-XII grossly intact, no focal deficits  Psych: Normal mood and affect   ASSESSMENT:   Christopher Hensley is a 89 y.o. male  who presents for the following: 1. SOB (shortness of breath)   2. Essential hypertension   3. Mixed hyperlipidemia     PLAN:   1. SOB (shortness of breath) -Secondary to obesity and deconditioning.  Normal echo.  Normal stress test.  He does have evidence of venous insufficiency which this is controlled with Lasix.  Echo last year showed no strong evidence of diastolic heart failure.  Examination largely unremarkable.  I recommended regular exercise will help.  Weight loss also.  2. Essential hypertension -BP values at home within limits.  No change to medications today.  3. Mixed hyperlipidemia -Diabetic.  On statin.  LDL at goal.      Disposition: Return in about 1 year (around 02/06/2024).  Medication Adjustments/Labs and Tests Ordered: Current medicines are reviewed at length with the patient today.  Concerns regarding medicines are outlined above.  No orders of the defined types were placed in this encounter.  No orders of the defined types were placed in this encounter.   Patient Instructions  Medication Instructions:  The current medical regimen is effective;  continue present plan and medications.  *If you need a refill on your cardiac medications before your next appointment, please call your pharmacy*   Follow-Up: At [facility], you and your health needs are our priority.  As part of our continuing mission to provide you with exceptional heart care, we have created designated Provider Care Teams.  These Care Teams include your primary Cardiologist (physician) and Advanced Practice Providers (APPs -  Physician Assistants and Nurse Practitioners) who all work together to provide you with the care you need, when you need it.  We recommend signing up for the patient portal called "MyChart".  Sign up information is provided on this After Visit Summary.  MyChart is used to connect with patients for Virtual Visits (Telemedicine).  Patients are able to view lab/test  results, encounter notes, upcoming appointments, etc.  Non-urgent messages can be sent to your provider as well.   To learn more about what you can do with MyChart, go to [email or web address].    Your next appointment:   12 month(s)  Provider:   Evalina Field, MD       Time Spent with Patient: I have spent a total of 25 minutes with patient reviewing hospital notes, telemetry, EKGs, labs and examining the patient as well as establishing an assessment and plan that was discussed with the patient.  > 50% of time was spent in direct patient care.  Signed, Addison Naegeli. Audie Box, MD, [place]  [address], [place], [place] [address] [phone]  02/05/2023 8:36 PM

## 2023-02-05 ENCOUNTER — Ambulatory Visit: Payer: Medicare Other | Attending: Cardiovascular Disease | Admitting: Cardiovascular Disease

## 2023-02-05 ENCOUNTER — Encounter: Payer: Self-pay | Admitting: Cardiovascular Disease

## 2023-02-05 VITALS — BP 150/66 | HR 67 | Ht 72.0 in | Wt 273.0 lb

## 2023-02-05 DIAGNOSIS — I10 Essential (primary) hypertension: Secondary | ICD-10-CM | POA: Diagnosis not present

## 2023-02-05 DIAGNOSIS — R0602 Shortness of breath: Secondary | ICD-10-CM | POA: Diagnosis not present

## 2023-02-05 DIAGNOSIS — E782 Mixed hyperlipidemia: Secondary | ICD-10-CM

## 2023-02-05 NOTE — Patient Instructions (Signed)
Medication Instructions:  The current medical regimen is effective;  continue present plan and medications.  *If you need a refill on your cardiac medications before your next appointment, please call your pharmacy*   Follow-Up: At [facility], you and your health needs are our priority.  As part of our continuing mission to provide you with exceptional heart care, we have created designated Provider Care Teams.  These Care Teams include your primary Cardiologist (physician) and Advanced Practice Providers (APPs -  Physician Assistants and Nurse Practitioners) who all work together to provide you with the care you need, when you need it.  We recommend signing up for the patient portal called "MyChart".  Sign up information is provided on this After Visit Summary.  MyChart is used to connect with patients for Virtual Visits (Telemedicine).  Patients are able to view lab/test results, encounter notes, upcoming appointments, etc.  Non-urgent messages can be sent to your provider as well.   To learn more about what you can do with MyChart, go to [email or web address].    Your next appointment:   12 month(s)  Provider:   Evalina Field, MD

## 2023-03-01 DIAGNOSIS — M25562 Pain in left knee: Secondary | ICD-10-CM | POA: Diagnosis not present

## 2023-03-01 DIAGNOSIS — Z96652 Presence of left artificial knee joint: Secondary | ICD-10-CM | POA: Diagnosis not present

## 2023-03-15 DIAGNOSIS — M25562 Pain in left knee: Secondary | ICD-10-CM | POA: Diagnosis not present

## 2023-03-24 DIAGNOSIS — M25562 Pain in left knee: Secondary | ICD-10-CM | POA: Diagnosis not present

## 2023-04-01 DIAGNOSIS — M25562 Pain in left knee: Secondary | ICD-10-CM | POA: Diagnosis not present

## 2023-04-08 DIAGNOSIS — M25562 Pain in left knee: Secondary | ICD-10-CM | POA: Diagnosis not present

## 2023-04-15 DIAGNOSIS — M25562 Pain in left knee: Secondary | ICD-10-CM | POA: Diagnosis not present

## 2023-04-27 ENCOUNTER — Other Ambulatory Visit: Payer: Self-pay | Admitting: Nurse Practitioner

## 2023-06-18 DIAGNOSIS — Z0000 Encounter for general adult medical examination without abnormal findings: Secondary | ICD-10-CM | POA: Diagnosis not present

## 2023-06-18 DIAGNOSIS — E7800 Pure hypercholesterolemia, unspecified: Secondary | ICD-10-CM | POA: Diagnosis not present

## 2023-06-18 DIAGNOSIS — Z79899 Other long term (current) drug therapy: Secondary | ICD-10-CM | POA: Diagnosis not present

## 2023-06-18 DIAGNOSIS — I700 Atherosclerosis of aorta: Secondary | ICD-10-CM | POA: Diagnosis not present

## 2023-06-18 DIAGNOSIS — L308 Other specified dermatitis: Secondary | ICD-10-CM | POA: Diagnosis not present

## 2023-06-18 DIAGNOSIS — Z23 Encounter for immunization: Secondary | ICD-10-CM | POA: Diagnosis not present

## 2023-06-18 DIAGNOSIS — N1832 Chronic kidney disease, stage 3b: Secondary | ICD-10-CM | POA: Diagnosis not present

## 2023-06-18 DIAGNOSIS — E1122 Type 2 diabetes mellitus with diabetic chronic kidney disease: Secondary | ICD-10-CM | POA: Diagnosis not present

## 2023-06-18 DIAGNOSIS — R21 Rash and other nonspecific skin eruption: Secondary | ICD-10-CM | POA: Diagnosis not present

## 2023-10-06 DIAGNOSIS — Z9989 Dependence on other enabling machines and devices: Secondary | ICD-10-CM | POA: Diagnosis not present

## 2023-10-06 DIAGNOSIS — T148XXA Other injury of unspecified body region, initial encounter: Secondary | ICD-10-CM | POA: Diagnosis not present

## 2023-10-06 DIAGNOSIS — I129 Hypertensive chronic kidney disease with stage 1 through stage 4 chronic kidney disease, or unspecified chronic kidney disease: Secondary | ICD-10-CM | POA: Diagnosis not present

## 2023-11-05 DIAGNOSIS — T148XXA Other injury of unspecified body region, initial encounter: Secondary | ICD-10-CM | POA: Diagnosis not present

## 2023-11-05 DIAGNOSIS — Z9989 Dependence on other enabling machines and devices: Secondary | ICD-10-CM | POA: Diagnosis not present

## 2023-11-05 DIAGNOSIS — R54 Age-related physical debility: Secondary | ICD-10-CM | POA: Diagnosis not present

## 2023-11-18 DIAGNOSIS — N1832 Chronic kidney disease, stage 3b: Secondary | ICD-10-CM | POA: Diagnosis not present

## 2023-11-18 DIAGNOSIS — Z4800 Encounter for change or removal of nonsurgical wound dressing: Secondary | ICD-10-CM | POA: Diagnosis not present

## 2023-11-18 DIAGNOSIS — I129 Hypertensive chronic kidney disease with stage 1 through stage 4 chronic kidney disease, or unspecified chronic kidney disease: Secondary | ICD-10-CM | POA: Diagnosis not present

## 2023-11-18 DIAGNOSIS — E1122 Type 2 diabetes mellitus with diabetic chronic kidney disease: Secondary | ICD-10-CM | POA: Diagnosis not present

## 2023-11-23 ENCOUNTER — Other Ambulatory Visit: Payer: Self-pay | Admitting: Urology

## 2023-11-23 DIAGNOSIS — D49512 Neoplasm of unspecified behavior of left kidney: Secondary | ICD-10-CM

## 2023-11-23 DIAGNOSIS — N281 Cyst of kidney, acquired: Secondary | ICD-10-CM

## 2023-11-26 DIAGNOSIS — N1832 Chronic kidney disease, stage 3b: Secondary | ICD-10-CM | POA: Diagnosis not present

## 2023-11-26 DIAGNOSIS — E1122 Type 2 diabetes mellitus with diabetic chronic kidney disease: Secondary | ICD-10-CM | POA: Diagnosis not present

## 2023-11-26 DIAGNOSIS — I129 Hypertensive chronic kidney disease with stage 1 through stage 4 chronic kidney disease, or unspecified chronic kidney disease: Secondary | ICD-10-CM | POA: Diagnosis not present

## 2023-11-26 DIAGNOSIS — Z4800 Encounter for change or removal of nonsurgical wound dressing: Secondary | ICD-10-CM | POA: Diagnosis not present

## 2023-12-03 DIAGNOSIS — Z4800 Encounter for change or removal of nonsurgical wound dressing: Secondary | ICD-10-CM | POA: Diagnosis not present

## 2023-12-03 DIAGNOSIS — N1832 Chronic kidney disease, stage 3b: Secondary | ICD-10-CM | POA: Diagnosis not present

## 2023-12-03 DIAGNOSIS — E1122 Type 2 diabetes mellitus with diabetic chronic kidney disease: Secondary | ICD-10-CM | POA: Diagnosis not present

## 2023-12-03 DIAGNOSIS — I129 Hypertensive chronic kidney disease with stage 1 through stage 4 chronic kidney disease, or unspecified chronic kidney disease: Secondary | ICD-10-CM | POA: Diagnosis not present

## 2023-12-13 DIAGNOSIS — H353132 Nonexudative age-related macular degeneration, bilateral, intermediate dry stage: Secondary | ICD-10-CM | POA: Diagnosis not present

## 2023-12-13 DIAGNOSIS — H524 Presbyopia: Secondary | ICD-10-CM | POA: Diagnosis not present

## 2023-12-13 DIAGNOSIS — H52223 Regular astigmatism, bilateral: Secondary | ICD-10-CM | POA: Diagnosis not present

## 2023-12-13 DIAGNOSIS — H04123 Dry eye syndrome of bilateral lacrimal glands: Secondary | ICD-10-CM | POA: Diagnosis not present

## 2023-12-13 DIAGNOSIS — E119 Type 2 diabetes mellitus without complications: Secondary | ICD-10-CM | POA: Diagnosis not present

## 2023-12-13 DIAGNOSIS — H35033 Hypertensive retinopathy, bilateral: Secondary | ICD-10-CM | POA: Diagnosis not present

## 2023-12-20 DIAGNOSIS — T148XXA Other injury of unspecified body region, initial encounter: Secondary | ICD-10-CM | POA: Diagnosis not present

## 2023-12-20 DIAGNOSIS — E1122 Type 2 diabetes mellitus with diabetic chronic kidney disease: Secondary | ICD-10-CM | POA: Diagnosis not present

## 2023-12-20 DIAGNOSIS — N1832 Chronic kidney disease, stage 3b: Secondary | ICD-10-CM | POA: Diagnosis not present

## 2023-12-20 DIAGNOSIS — I129 Hypertensive chronic kidney disease with stage 1 through stage 4 chronic kidney disease, or unspecified chronic kidney disease: Secondary | ICD-10-CM | POA: Diagnosis not present

## 2024-01-01 ENCOUNTER — Ambulatory Visit
Admission: RE | Admit: 2024-01-01 | Discharge: 2024-01-01 | Disposition: A | Discharge status: Home / Self Care | Payer: Medicare Other | Source: Ambulatory Visit | Attending: Urology | Admitting: Urology

## 2024-01-01 DIAGNOSIS — N281 Cyst of kidney, acquired: Secondary | ICD-10-CM

## 2024-01-01 DIAGNOSIS — R93422 Abnormal radiologic findings on diagnostic imaging of left kidney: Secondary | ICD-10-CM | POA: Diagnosis not present

## 2024-01-01 DIAGNOSIS — D49512 Neoplasm of unspecified behavior of left kidney: Secondary | ICD-10-CM

## 2024-01-01 DIAGNOSIS — K8020 Calculus of gallbladder without cholecystitis without obstruction: Secondary | ICD-10-CM | POA: Diagnosis not present

## 2024-01-01 MED ORDER — GADOPICLENOL 0.5 MMOL/ML IV SOLN
10.0000 mL | Freq: Once | INTRAVENOUS | Status: AC | PRN
  Administered 2024-01-01: 10 mL via INTRAVENOUS

## 2024-01-13 DIAGNOSIS — D49511 Neoplasm of unspecified behavior of right kidney: Secondary | ICD-10-CM | POA: Diagnosis not present

## 2024-01-13 DIAGNOSIS — D49512 Neoplasm of unspecified behavior of left kidney: Secondary | ICD-10-CM | POA: Diagnosis not present

## 2024-01-26 DIAGNOSIS — N1832 Chronic kidney disease, stage 3b: Secondary | ICD-10-CM | POA: Diagnosis not present

## 2024-03-22 DIAGNOSIS — N1832 Chronic kidney disease, stage 3b: Secondary | ICD-10-CM | POA: Diagnosis not present

## 2024-03-22 DIAGNOSIS — R7303 Prediabetes: Secondary | ICD-10-CM | POA: Diagnosis not present

## 2024-03-22 DIAGNOSIS — R809 Proteinuria, unspecified: Secondary | ICD-10-CM | POA: Diagnosis not present

## 2024-03-22 DIAGNOSIS — N2889 Other specified disorders of kidney and ureter: Secondary | ICD-10-CM | POA: Diagnosis not present

## 2024-03-22 DIAGNOSIS — I129 Hypertensive chronic kidney disease with stage 1 through stage 4 chronic kidney disease, or unspecified chronic kidney disease: Secondary | ICD-10-CM | POA: Diagnosis not present

## 2024-05-13 DIAGNOSIS — E7800 Pure hypercholesterolemia, unspecified: Secondary | ICD-10-CM | POA: Diagnosis not present

## 2024-05-13 DIAGNOSIS — I129 Hypertensive chronic kidney disease with stage 1 through stage 4 chronic kidney disease, or unspecified chronic kidney disease: Secondary | ICD-10-CM | POA: Diagnosis not present

## 2024-05-13 DIAGNOSIS — H35033 Hypertensive retinopathy, bilateral: Secondary | ICD-10-CM | POA: Diagnosis not present

## 2024-05-13 DIAGNOSIS — E1122 Type 2 diabetes mellitus with diabetic chronic kidney disease: Secondary | ICD-10-CM | POA: Diagnosis not present

## 2024-05-31 DIAGNOSIS — I129 Hypertensive chronic kidney disease with stage 1 through stage 4 chronic kidney disease, or unspecified chronic kidney disease: Secondary | ICD-10-CM | POA: Diagnosis not present

## 2024-05-31 DIAGNOSIS — R54 Age-related physical debility: Secondary | ICD-10-CM | POA: Diagnosis not present

## 2024-05-31 DIAGNOSIS — R053 Chronic cough: Secondary | ICD-10-CM | POA: Diagnosis not present

## 2024-05-31 DIAGNOSIS — N1832 Chronic kidney disease, stage 3b: Secondary | ICD-10-CM | POA: Diagnosis not present

## 2024-05-31 DIAGNOSIS — R296 Repeated falls: Secondary | ICD-10-CM | POA: Diagnosis not present

## 2024-05-31 DIAGNOSIS — T148XXA Other injury of unspecified body region, initial encounter: Secondary | ICD-10-CM | POA: Diagnosis not present

## 2024-06-12 DIAGNOSIS — H35033 Hypertensive retinopathy, bilateral: Secondary | ICD-10-CM | POA: Diagnosis not present

## 2024-06-12 DIAGNOSIS — I129 Hypertensive chronic kidney disease with stage 1 through stage 4 chronic kidney disease, or unspecified chronic kidney disease: Secondary | ICD-10-CM | POA: Diagnosis not present

## 2024-06-12 DIAGNOSIS — E7800 Pure hypercholesterolemia, unspecified: Secondary | ICD-10-CM | POA: Diagnosis not present

## 2024-06-12 DIAGNOSIS — E1122 Type 2 diabetes mellitus with diabetic chronic kidney disease: Secondary | ICD-10-CM | POA: Diagnosis not present

## 2024-06-21 ENCOUNTER — Other Ambulatory Visit: Payer: Self-pay | Admitting: Cardiovascular Disease

## 2024-07-13 DIAGNOSIS — H35033 Hypertensive retinopathy, bilateral: Secondary | ICD-10-CM | POA: Diagnosis not present

## 2024-07-13 DIAGNOSIS — E1122 Type 2 diabetes mellitus with diabetic chronic kidney disease: Secondary | ICD-10-CM | POA: Diagnosis not present

## 2024-07-13 DIAGNOSIS — E7800 Pure hypercholesterolemia, unspecified: Secondary | ICD-10-CM | POA: Diagnosis not present

## 2024-07-13 DIAGNOSIS — I129 Hypertensive chronic kidney disease with stage 1 through stage 4 chronic kidney disease, or unspecified chronic kidney disease: Secondary | ICD-10-CM | POA: Diagnosis not present

## 2024-07-21 DIAGNOSIS — Z79899 Other long term (current) drug therapy: Secondary | ICD-10-CM | POA: Diagnosis not present

## 2024-07-21 DIAGNOSIS — R296 Repeated falls: Secondary | ICD-10-CM | POA: Diagnosis not present

## 2024-07-21 DIAGNOSIS — T148XXA Other injury of unspecified body region, initial encounter: Secondary | ICD-10-CM | POA: Diagnosis not present

## 2024-07-21 DIAGNOSIS — E1122 Type 2 diabetes mellitus with diabetic chronic kidney disease: Secondary | ICD-10-CM | POA: Diagnosis not present

## 2024-07-21 DIAGNOSIS — I129 Hypertensive chronic kidney disease with stage 1 through stage 4 chronic kidney disease, or unspecified chronic kidney disease: Secondary | ICD-10-CM | POA: Diagnosis not present

## 2024-07-21 DIAGNOSIS — Z0000 Encounter for general adult medical examination without abnormal findings: Secondary | ICD-10-CM | POA: Diagnosis not present

## 2024-07-21 DIAGNOSIS — N1832 Chronic kidney disease, stage 3b: Secondary | ICD-10-CM | POA: Diagnosis not present

## 2024-07-21 DIAGNOSIS — M25552 Pain in left hip: Secondary | ICD-10-CM | POA: Diagnosis not present

## 2024-07-21 DIAGNOSIS — Z23 Encounter for immunization: Secondary | ICD-10-CM | POA: Diagnosis not present

## 2024-07-21 DIAGNOSIS — R053 Chronic cough: Secondary | ICD-10-CM | POA: Diagnosis not present

## 2024-07-21 DIAGNOSIS — R600 Localized edema: Secondary | ICD-10-CM | POA: Diagnosis not present

## 2024-07-23 DIAGNOSIS — R053 Chronic cough: Secondary | ICD-10-CM | POA: Diagnosis not present

## 2024-07-23 DIAGNOSIS — M25552 Pain in left hip: Secondary | ICD-10-CM | POA: Diagnosis not present

## 2024-08-02 DIAGNOSIS — Z79899 Other long term (current) drug therapy: Secondary | ICD-10-CM | POA: Diagnosis not present

## 2024-08-03 ENCOUNTER — Other Ambulatory Visit (HOSPITAL_COMMUNITY): Payer: Self-pay | Admitting: Sports Medicine

## 2024-08-03 DIAGNOSIS — M25552 Pain in left hip: Secondary | ICD-10-CM

## 2024-08-10 ENCOUNTER — Ambulatory Visit (HOSPITAL_COMMUNITY)
Admission: RE | Admit: 2024-08-10 | Discharge: 2024-08-10 | Disposition: A | Discharge status: Home / Self Care | Source: Ambulatory Visit | Attending: Sports Medicine | Admitting: Sports Medicine

## 2024-08-10 DIAGNOSIS — M25552 Pain in left hip: Secondary | ICD-10-CM | POA: Insufficient documentation

## 2024-08-10 DIAGNOSIS — M1612 Unilateral primary osteoarthritis, left hip: Secondary | ICD-10-CM | POA: Diagnosis not present

## 2024-08-13 DIAGNOSIS — E7800 Pure hypercholesterolemia, unspecified: Secondary | ICD-10-CM | POA: Diagnosis not present

## 2024-08-13 DIAGNOSIS — H35033 Hypertensive retinopathy, bilateral: Secondary | ICD-10-CM | POA: Diagnosis not present

## 2024-08-13 DIAGNOSIS — E1122 Type 2 diabetes mellitus with diabetic chronic kidney disease: Secondary | ICD-10-CM | POA: Diagnosis not present

## 2024-08-13 DIAGNOSIS — I129 Hypertensive chronic kidney disease with stage 1 through stage 4 chronic kidney disease, or unspecified chronic kidney disease: Secondary | ICD-10-CM | POA: Diagnosis not present

## 2024-08-29 ENCOUNTER — Encounter (HOSPITAL_BASED_OUTPATIENT_CLINIC_OR_DEPARTMENT_OTHER): Attending: General Surgery | Admitting: General Surgery

## 2024-08-29 DIAGNOSIS — N1832 Chronic kidney disease, stage 3b: Secondary | ICD-10-CM | POA: Diagnosis not present

## 2024-08-29 DIAGNOSIS — E11622 Type 2 diabetes mellitus with other skin ulcer: Secondary | ICD-10-CM | POA: Insufficient documentation

## 2024-08-29 DIAGNOSIS — E1122 Type 2 diabetes mellitus with diabetic chronic kidney disease: Secondary | ICD-10-CM | POA: Insufficient documentation

## 2024-08-29 DIAGNOSIS — L98419 Non-pressure chronic ulcer of buttock with unspecified severity: Secondary | ICD-10-CM | POA: Insufficient documentation

## 2024-09-09 ENCOUNTER — Other Ambulatory Visit: Payer: Self-pay | Admitting: Cardiovascular Disease

## 2024-09-12 DIAGNOSIS — H35033 Hypertensive retinopathy, bilateral: Secondary | ICD-10-CM | POA: Diagnosis not present

## 2024-09-12 DIAGNOSIS — E1122 Type 2 diabetes mellitus with diabetic chronic kidney disease: Secondary | ICD-10-CM | POA: Diagnosis not present

## 2024-09-12 DIAGNOSIS — E7800 Pure hypercholesterolemia, unspecified: Secondary | ICD-10-CM | POA: Diagnosis not present

## 2024-09-12 DIAGNOSIS — I129 Hypertensive chronic kidney disease with stage 1 through stage 4 chronic kidney disease, or unspecified chronic kidney disease: Secondary | ICD-10-CM | POA: Diagnosis not present

## 2024-09-19 DIAGNOSIS — N1832 Chronic kidney disease, stage 3b: Secondary | ICD-10-CM | POA: Diagnosis not present

## 2024-09-25 DIAGNOSIS — R7303 Prediabetes: Secondary | ICD-10-CM | POA: Diagnosis not present

## 2024-09-25 DIAGNOSIS — N2889 Other specified disorders of kidney and ureter: Secondary | ICD-10-CM | POA: Diagnosis not present

## 2024-09-25 DIAGNOSIS — I129 Hypertensive chronic kidney disease with stage 1 through stage 4 chronic kidney disease, or unspecified chronic kidney disease: Secondary | ICD-10-CM | POA: Diagnosis not present

## 2024-09-25 DIAGNOSIS — N1832 Chronic kidney disease, stage 3b: Secondary | ICD-10-CM | POA: Diagnosis not present

## 2024-09-25 DIAGNOSIS — R809 Proteinuria, unspecified: Secondary | ICD-10-CM | POA: Diagnosis not present

## 2024-10-26 ENCOUNTER — Other Ambulatory Visit: Payer: Self-pay | Admitting: Urology

## 2024-10-26 DIAGNOSIS — D49511 Neoplasm of unspecified behavior of right kidney: Secondary | ICD-10-CM

## 2024-10-26 DIAGNOSIS — D49512 Neoplasm of unspecified behavior of left kidney: Secondary | ICD-10-CM

## 2025-01-02 ENCOUNTER — Ambulatory Visit
Admission: RE | Admit: 2025-01-02 | Discharge: 2025-01-02 | Disposition: A | Source: Ambulatory Visit | Attending: Urology

## 2025-01-02 DIAGNOSIS — D49511 Neoplasm of unspecified behavior of right kidney: Secondary | ICD-10-CM

## 2025-01-02 DIAGNOSIS — D49512 Neoplasm of unspecified behavior of left kidney: Secondary | ICD-10-CM

## 2025-01-02 MED ORDER — GADOPICLENOL 0.5 MMOL/ML IV SOLN
10.0000 mL | Freq: Once | INTRAVENOUS | Status: AC | PRN
  Administered 2025-01-02: 10 mL via INTRAVENOUS
